# Patient Record
Sex: Male | Born: 1950
Health system: Southern US, Community
[De-identification: ages and names within clinical notes are randomized; demographics above are authoritative.]

## PROBLEM LIST (undated history)

## (undated) DIAGNOSIS — N189 Chronic kidney disease, unspecified: Secondary | ICD-10-CM

## (undated) DIAGNOSIS — I10 Essential (primary) hypertension: Secondary | ICD-10-CM

## (undated) HISTORY — PX: KIDNEY TRANSPLANT: SHX239

## (undated) HISTORY — DX: Essential (primary) hypertension: I10

## (undated) HISTORY — DX: Chronic kidney disease, unspecified: N18.9

## (undated) HISTORY — PX: HERNIA REPAIR: SHX51

---

## 2011-02-12 DIAGNOSIS — C44629 Squamous cell carcinoma of skin of left upper limb, including shoulder: Secondary | ICD-10-CM | POA: Insufficient documentation

## 2012-11-21 ENCOUNTER — Telehealth: Payer: Self-pay | Admitting: Family Medicine

## 2012-12-03 NOTE — Telephone Encounter (Signed)
No call back from 8-28. Will call back if appt still needed

## 2013-05-03 DIAGNOSIS — M2010 Hallux valgus (acquired), unspecified foot: Secondary | ICD-10-CM | POA: Diagnosis not present

## 2013-06-04 DIAGNOSIS — N039 Chronic nephritic syndrome with unspecified morphologic changes: Secondary | ICD-10-CM | POA: Diagnosis not present

## 2013-06-04 DIAGNOSIS — Z940 Kidney transplant status: Secondary | ICD-10-CM | POA: Diagnosis not present

## 2013-06-04 DIAGNOSIS — I10 Essential (primary) hypertension: Secondary | ICD-10-CM | POA: Diagnosis not present

## 2013-06-04 DIAGNOSIS — D631 Anemia in chronic kidney disease: Secondary | ICD-10-CM | POA: Diagnosis not present

## 2013-06-10 DIAGNOSIS — Z940 Kidney transplant status: Secondary | ICD-10-CM | POA: Diagnosis not present

## 2013-06-10 DIAGNOSIS — D631 Anemia in chronic kidney disease: Secondary | ICD-10-CM | POA: Diagnosis not present

## 2013-09-02 ENCOUNTER — Encounter (INDEPENDENT_AMBULATORY_CARE_PROVIDER_SITE_OTHER): Payer: Self-pay

## 2013-09-02 ENCOUNTER — Ambulatory Visit (INDEPENDENT_AMBULATORY_CARE_PROVIDER_SITE_OTHER): Payer: BC Managed Care – PPO | Admitting: Family Medicine

## 2013-09-02 ENCOUNTER — Encounter: Payer: Self-pay | Admitting: Family Medicine

## 2013-09-02 VITALS — BP 139/70 | HR 62 | Temp 97.3°F | Ht 70.0 in | Wt 167.0 lb

## 2013-09-02 DIAGNOSIS — J329 Chronic sinusitis, unspecified: Secondary | ICD-10-CM

## 2013-09-02 DIAGNOSIS — Z87448 Personal history of other diseases of urinary system: Secondary | ICD-10-CM

## 2013-09-02 DIAGNOSIS — I10 Essential (primary) hypertension: Secondary | ICD-10-CM | POA: Diagnosis not present

## 2013-09-02 DIAGNOSIS — Z87718 Personal history of other specified (corrected) congenital malformations of genitourinary system: Secondary | ICD-10-CM

## 2013-09-02 DIAGNOSIS — Z940 Kidney transplant status: Secondary | ICD-10-CM

## 2013-09-02 DIAGNOSIS — K219 Gastro-esophageal reflux disease without esophagitis: Secondary | ICD-10-CM

## 2013-09-02 DIAGNOSIS — J310 Chronic rhinitis: Secondary | ICD-10-CM

## 2013-09-02 MED ORDER — AMOXICILLIN 500 MG PO CAPS: 500.0000 mg | ORAL_CAPSULE | Freq: Three times a day (TID) | ORAL | Status: DC

## 2013-09-02 NOTE — Patient Instructions (Signed)
Drink plenty of fluids Take Tylenol as needed for aches pains and fever Take antibiotic as directed Mucinex blue and white over-the-counter one twice daily for cough and congestion with a large glass of water

## 2013-09-02 NOTE — Progress Notes (Signed)
Subjective:    Patient ID: Colton Todd, male    DOB: 03/03/1951, 63 y.o.   MRN: [id number]  HPI Patient here today for sinus trouble, drainage, and itchy eyes. He has had this problem for 2 weeks.        There are no active problems to display for this patient.  Outpatient Encounter Prescriptions as of 09/02/2013  Medication Sig  . allopurinol (ZYLOPRIM) 100 MG tablet Take 100 mg by mouth 2 (two) times daily.  . fluconazole (DIFLUCAN) 200 MG tablet Take 200 mg by mouth daily.  Marland Kitchen losartan (COZAAR) 50 MG tablet Take 50 mg by mouth daily.  . metoprolol tartrate (LOPRESSOR) 25 MG tablet Take 25 mg by mouth 2 (two) times daily.  . mycophenolate (MYFORTIC) 360 MG TBEC EC tablet Take 360 mg by mouth 2 (two) times daily.  Marland Kitchen NIFEdipine (PROCARDIA-XL/ADALAT CC) 60 MG 24 hr tablet Take 60 mg by mouth daily.  . pantoprazole (PROTONIX) 40 MG tablet Take 40 mg by mouth daily.  . pregabalin (LYRICA) 50 MG capsule Take 50 mg by mouth 3 (three) times daily.  . sodium bicarbonate 650 MG tablet Take 650 mg by mouth 4 (four) times daily.  . tacrolimus (PROGRAF) 1 MG capsule Take 1 mg by mouth 2 (two) times daily.    Review of Systems  Constitutional: Negative.  Negative for fever.  HENT: Positive for congestion, postnasal drip and sinus pressure.   Eyes: Positive for discharge and itching.  Respiratory: Negative.   Cardiovascular: Negative.   Gastrointestinal: Negative.   Endocrine: Negative.   Genitourinary: Negative.   Musculoskeletal: Negative.   Skin: Negative.   Allergic/Immunologic: Negative.   Neurological: Negative.  Negative for headaches.  Hematological: Negative.   Psychiatric/Behavioral: Negative.        Objective:   Physical Exam  Nursing note and vitals reviewed. Constitutional: He is oriented to person, place, and time. He appears well-developed and well-nourished. No distress.  HENT:  Head: Normocephalic and atraumatic.  Right Ear: External ear normal.  Left Ear:  External ear normal.  Mouth/Throat: Oropharynx is clear and moist. No oropharyngeal exudate.  Congestion bilaterally, no sinus tenderness with applied pressure  Eyes: EOM are normal. Pupils are equal, round, and reactive to light. Right eye exhibits no discharge. Left eye exhibits no discharge. No scleral icterus.  Conjunctivae are slightly red bilaterally  Neck: Normal range of motion. Neck supple. No thyromegaly present.  Cardiovascular: Normal rate, regular rhythm and normal heart sounds.   No murmur heard. At 60 per minute  Pulmonary/Chest: Effort normal and breath sounds normal. No respiratory distress. He has no wheezes. He has no rales. He exhibits no tenderness.  Slightly congested cough  Abdominal: Soft. Bowel sounds are normal. He exhibits no mass. There is no tenderness. There is no rebound and no guarding.  Transplant scars bilaterally  Genitourinary: Rectum normal.  Musculoskeletal: Normal range of motion. He exhibits no edema.  Lymphadenopathy:    He has no cervical adenopathy.  Neurological: He is alert and oriented to person, place, and time.  Skin: Skin is warm and dry. No rash noted.  Psychiatric: He has a normal mood and affect. His behavior is normal. Judgment and thought content normal.   BP 139/70  Pulse 62  Temp(Src) 97.3 F (36.3 C) (Oral)  Ht 5\' 10"  (1.778 m)  Wt 167 lb (75.751 kg)  BMI 23.96 kg/m2        Assessment & Plan:  1. Hypertension  2. GERD (gastroesophageal reflux  disease)  3. Rhinosinusitis - amoxicillin (AMOXIL) 500 MG capsule; Take 1 capsule (500 mg total) by mouth 3 (three) times daily.  Dispense: 30 capsule; Refill: 0  4. History of polycystic kidney disease  5. History of renal transplant Patient Instructions  Drink plenty of fluids Take Tylenol as needed for aches pains and fever Take antibiotic as directed Mucinex blue and white over-the-counter one twice daily for cough and congestion with a large glass of water   Arrie Senate MD

## 2013-10-08 DIAGNOSIS — D631 Anemia in chronic kidney disease: Secondary | ICD-10-CM | POA: Diagnosis not present

## 2013-10-08 DIAGNOSIS — Z940 Kidney transplant status: Secondary | ICD-10-CM | POA: Diagnosis not present

## 2013-10-08 DIAGNOSIS — I10 Essential (primary) hypertension: Secondary | ICD-10-CM | POA: Diagnosis not present

## 2013-10-08 DIAGNOSIS — N039 Chronic nephritic syndrome with unspecified morphologic changes: Secondary | ICD-10-CM | POA: Diagnosis not present

## 2013-11-24 DIAGNOSIS — I808 Phlebitis and thrombophlebitis of other sites: Secondary | ICD-10-CM | POA: Diagnosis not present

## 2013-11-25 DIAGNOSIS — T82898A Other specified complication of vascular prosthetic devices, implants and grafts, initial encounter: Secondary | ICD-10-CM | POA: Diagnosis not present

## 2013-11-25 DIAGNOSIS — M79609 Pain in unspecified limb: Secondary | ICD-10-CM | POA: Diagnosis not present

## 2013-11-25 DIAGNOSIS — I749 Embolism and thrombosis of unspecified artery: Secondary | ICD-10-CM | POA: Diagnosis not present

## 2013-12-16 DIAGNOSIS — C44221 Squamous cell carcinoma of skin of unspecified ear and external auricular canal: Secondary | ICD-10-CM | POA: Diagnosis not present

## 2013-12-16 DIAGNOSIS — D485 Neoplasm of uncertain behavior of skin: Secondary | ICD-10-CM | POA: Diagnosis not present

## 2013-12-16 DIAGNOSIS — L819 Disorder of pigmentation, unspecified: Secondary | ICD-10-CM | POA: Diagnosis not present

## 2013-12-16 DIAGNOSIS — Z85828 Personal history of other malignant neoplasm of skin: Secondary | ICD-10-CM | POA: Diagnosis not present

## 2014-01-27 DIAGNOSIS — D0422 Carcinoma in situ of skin of left ear and external auricular canal: Secondary | ICD-10-CM | POA: Diagnosis not present

## 2014-01-27 DIAGNOSIS — C44519 Basal cell carcinoma of skin of other part of trunk: Secondary | ICD-10-CM | POA: Diagnosis not present

## 2014-01-27 DIAGNOSIS — D0439 Carcinoma in situ of skin of other parts of face: Secondary | ICD-10-CM | POA: Diagnosis not present

## 2014-01-27 DIAGNOSIS — C44319 Basal cell carcinoma of skin of other parts of face: Secondary | ICD-10-CM | POA: Diagnosis not present

## 2014-02-18 DIAGNOSIS — Q612 Polycystic kidney, adult type: Secondary | ICD-10-CM | POA: Diagnosis not present

## 2014-02-18 DIAGNOSIS — Z940 Kidney transplant status: Secondary | ICD-10-CM | POA: Diagnosis not present

## 2014-02-18 DIAGNOSIS — I10 Essential (primary) hypertension: Secondary | ICD-10-CM | POA: Diagnosis not present

## 2014-05-27 DIAGNOSIS — I10 Essential (primary) hypertension: Secondary | ICD-10-CM | POA: Diagnosis not present

## 2014-05-27 DIAGNOSIS — Z940 Kidney transplant status: Secondary | ICD-10-CM | POA: Diagnosis not present

## 2014-05-27 DIAGNOSIS — D631 Anemia in chronic kidney disease: Secondary | ICD-10-CM | POA: Diagnosis not present

## 2014-08-11 DIAGNOSIS — D489 Neoplasm of uncertain behavior, unspecified: Secondary | ICD-10-CM | POA: Diagnosis not present

## 2014-08-11 DIAGNOSIS — L570 Actinic keratosis: Secondary | ICD-10-CM | POA: Diagnosis not present

## 2014-08-11 DIAGNOSIS — Z85828 Personal history of other malignant neoplasm of skin: Secondary | ICD-10-CM | POA: Diagnosis not present

## 2014-08-18 DIAGNOSIS — I10 Essential (primary) hypertension: Secondary | ICD-10-CM | POA: Diagnosis not present

## 2014-08-18 DIAGNOSIS — Z940 Kidney transplant status: Secondary | ICD-10-CM | POA: Diagnosis not present

## 2014-08-18 DIAGNOSIS — L03114 Cellulitis of left upper limb: Secondary | ICD-10-CM | POA: Diagnosis not present

## 2014-09-16 DIAGNOSIS — L03221 Cellulitis of neck: Secondary | ICD-10-CM | POA: Diagnosis not present

## 2014-09-16 DIAGNOSIS — W19XXXA Unspecified fall, initial encounter: Secondary | ICD-10-CM | POA: Diagnosis not present

## 2014-09-16 DIAGNOSIS — M542 Cervicalgia: Secondary | ICD-10-CM | POA: Diagnosis not present

## 2014-09-16 DIAGNOSIS — S161XXA Strain of muscle, fascia and tendon at neck level, initial encounter: Secondary | ICD-10-CM | POA: Diagnosis not present

## 2014-09-16 DIAGNOSIS — S199XXA Unspecified injury of neck, initial encounter: Secondary | ICD-10-CM | POA: Diagnosis not present

## 2015-01-27 DIAGNOSIS — C44619 Basal cell carcinoma of skin of left upper limb, including shoulder: Secondary | ICD-10-CM | POA: Diagnosis not present

## 2015-01-27 DIAGNOSIS — Z85828 Personal history of other malignant neoplasm of skin: Secondary | ICD-10-CM | POA: Diagnosis not present

## 2015-01-27 DIAGNOSIS — Z87891 Personal history of nicotine dependence: Secondary | ICD-10-CM | POA: Diagnosis not present

## 2015-01-27 DIAGNOSIS — L570 Actinic keratosis: Secondary | ICD-10-CM | POA: Diagnosis not present

## 2015-01-27 DIAGNOSIS — I129 Hypertensive chronic kidney disease with stage 1 through stage 4 chronic kidney disease, or unspecified chronic kidney disease: Secondary | ICD-10-CM | POA: Diagnosis not present

## 2015-01-27 DIAGNOSIS — D0462 Carcinoma in situ of skin of left upper limb, including shoulder: Secondary | ICD-10-CM | POA: Diagnosis not present

## 2015-01-27 DIAGNOSIS — N189 Chronic kidney disease, unspecified: Secondary | ICD-10-CM | POA: Diagnosis not present

## 2015-02-10 DIAGNOSIS — D0462 Carcinoma in situ of skin of left upper limb, including shoulder: Secondary | ICD-10-CM | POA: Diagnosis not present

## 2015-02-10 DIAGNOSIS — C44619 Basal cell carcinoma of skin of left upper limb, including shoulder: Secondary | ICD-10-CM | POA: Diagnosis not present

## 2015-06-22 ENCOUNTER — Encounter: Payer: Self-pay | Admitting: Family Medicine

## 2015-06-22 ENCOUNTER — Ambulatory Visit (INDEPENDENT_AMBULATORY_CARE_PROVIDER_SITE_OTHER): Payer: BLUE CROSS/BLUE SHIELD | Admitting: Family Medicine

## 2015-06-22 VITALS — BP 124/78 | HR 73 | Temp 97.2°F | Ht 70.0 in | Wt 165.0 lb

## 2015-06-22 DIAGNOSIS — K140 Glossitis: Secondary | ICD-10-CM

## 2015-06-22 MED ORDER — AMOXICILLIN 875 MG PO TABS: 875.0000 mg | ORAL_TABLET | Freq: Two times a day (BID) | ORAL | Status: DC

## 2015-06-22 MED ORDER — VALACYCLOVIR HCL 1 G PO TABS: 1000.0000 mg | ORAL_TABLET | Freq: Three times a day (TID) | ORAL | Status: DC

## 2015-06-22 NOTE — Progress Notes (Signed)
BP 124/78 mmHg  Pulse 73  Temp(Src) 97.2 F (36.2 C) (Oral)  Ht 5\' 10"  (1.778 m)  Wt 165 lb (74.844 kg)  BMI 23.68 kg/m2   Subjective:    Patient ID: Colton Todd, male    DOB: 1951-02-06, 65 y.o.   MRN: [id number]  HPI: Colton Todd is a 65 y.o. male presenting on 06/22/2015 for Lesion on right side of tongue   HPI Ulceration on tongue Patient is coming in today because he has had an ulceration on the right side of his tongue that he says only been there for about 5 days. He says he has a lot of pain and tenderness associated with it. Especially when he chooses tobacco he has a lot of pain. He denies any fevers or chills or any sores anywhere else. He has been chewing tobacco for quite a few years. He has not had any drainage from the area. He does note that side of his mouth and he chews tobacco on him regularly.  Relevant past medical, surgical, family and social history reviewed and updated as indicated. Interim medical history since our last visit reviewed. Allergies and medications reviewed and updated.  Review of Systems  Constitutional: Negative for fever.  HENT: Positive for mouth sores. Negative for ear discharge and ear pain.   Eyes: Negative for discharge and visual disturbance.  Respiratory: Negative for shortness of breath and wheezing.   Cardiovascular: Negative for chest pain and leg swelling.  Gastrointestinal: Negative for abdominal pain, diarrhea and constipation.  Genitourinary: Negative for difficulty urinating.  Musculoskeletal: Negative for back pain and gait problem.  Skin: Negative for rash.  Neurological: Negative for syncope, light-headedness and headaches.  All other systems reviewed and are negative.   Per HPI unless specifically indicated above     Medication List       This list is accurate as of: 06/22/15 11:15 AM.  Always use your most recent med list.               allopurinol 100 MG tablet  Commonly known as:  ZYLOPRIM  Take  100 mg by mouth 2 (two) times daily.     amoxicillin 875 MG tablet  Commonly known as:  AMOXIL  Take 1 tablet (875 mg total) by mouth 2 (two) times daily.     losartan 50 MG tablet  Commonly known as:  COZAAR  Take 50 mg by mouth daily.     metoprolol tartrate 25 MG tablet  Commonly known as:  LOPRESSOR  Take 25 mg by mouth 2 (two) times daily.     mycophenolate 360 MG Tbec EC tablet  Commonly known as:  MYFORTIC  Take 360 mg by mouth 2 (two) times daily.     mycophenolate 180 MG EC tablet  Commonly known as:  MYFORTIC  Take 180 mg by mouth.     NIFEdipine 60 MG 24 hr tablet  Commonly known as:  PROCARDIA-XL/ADALAT CC  Take 60 mg by mouth daily.     NIFEdipine 60 MG 24 hr tablet  Commonly known as:  PROCARDIA-XL/ADALAT CC  Take 60 mg by mouth.     pantoprazole 40 MG tablet  Commonly known as:  PROTONIX  Take 40 mg by mouth daily.     pregabalin 50 MG capsule  Commonly known as:  LYRICA  Take 50 mg by mouth 3 (three) times daily.     sodium bicarbonate 650 MG tablet  Take 650 mg by mouth 4 (four)  times daily.     valACYclovir 1000 MG tablet  Commonly known as:  VALTREX  Take 1 tablet (1,000 mg total) by mouth 3 (three) times daily.           Objective:    BP 124/78 mmHg  Pulse 73  Temp(Src) 97.2 F (36.2 C) (Oral)  Ht 5\' 10"  (1.778 m)  Wt 165 lb (74.844 kg)  BMI 23.68 kg/m2  Wt Readings from Last 3 Encounters:  06/22/15 165 lb (74.844 kg)  09/02/13 167 lb (75.751 kg)    Physical Exam  Constitutional: He is oriented to person, place, and time. He appears well-developed and well-nourished. No distress.  HENT:  Right Ear: Tympanic membrane normal.  Left Ear: Tympanic membrane normal.  Nose: Nose normal.  Mouth/Throat: Uvula is midline, oropharynx is clear and moist and mucous membranes are normal.  Ulceration on the right aspect of his tongue. 1 cm ulceration with small indentation in the center.  Eyes: Conjunctivae and EOM are normal. Pupils are  equal, round, and reactive to light. Right eye exhibits no discharge. No scleral icterus.  Neck: Neck supple. No thyromegaly present.  Cardiovascular: Normal rate, regular rhythm, normal heart sounds and intact distal pulses.   No murmur heard. Pulmonary/Chest: Effort normal and breath sounds normal. No respiratory distress. He has no wheezes.  Musculoskeletal: Normal range of motion. He exhibits no edema.  Lymphadenopathy:    He has no cervical adenopathy.  Neurological: He is alert and oriented to person, place, and time. Coordination normal.  Skin: Skin is warm and dry. No rash noted. He is not diaphoretic.  Psychiatric: He has a normal mood and affect. His behavior is normal.  Vitals reviewed.   No results found for this or any previous visit.    Assessment & Plan:       Problem List Items Addressed This Visit    None    Visit Diagnoses    Ulcerated tongue    -  Primary    Large 1 cm ulceration on right lateral tongue, antibiotic and antiviral, return in 1 week, patient chews tobacco, consider ENT if not resolved    Relevant Medications    valACYclovir (VALTREX) 1000 MG tablet    amoxicillin (AMOXIL) 875 MG tablet        Follow up plan: Return in about 1 week (around 06/29/2015), or if symptoms worsen or fail to improve, for Follow-up tongue ulceration.  Counseling provided for all of the vaccine components No orders of the defined types were placed in this encounter.    Caryl Pina, MD [facility] 06/22/2015, 11:15 AM

## 2015-06-27 DIAGNOSIS — N186 End stage renal disease: Secondary | ICD-10-CM | POA: Diagnosis present

## 2015-06-27 DIAGNOSIS — Z940 Kidney transplant status: Secondary | ICD-10-CM | POA: Diagnosis not present

## 2015-06-27 DIAGNOSIS — I10 Essential (primary) hypertension: Secondary | ICD-10-CM | POA: Diagnosis not present

## 2015-06-27 DIAGNOSIS — B955 Unspecified streptococcus as the cause of diseases classified elsewhere: Secondary | ICD-10-CM | POA: Diagnosis present

## 2015-06-27 DIAGNOSIS — M01X0 Direct infection of unspecified joint in infectious and parasitic diseases classified elsewhere: Secondary | ICD-10-CM | POA: Diagnosis not present

## 2015-06-27 DIAGNOSIS — I253 Aneurysm of heart: Secondary | ICD-10-CM | POA: Diagnosis not present

## 2015-06-27 DIAGNOSIS — M00022 Staphylococcal arthritis, left elbow: Secondary | ICD-10-CM | POA: Diagnosis present

## 2015-06-27 DIAGNOSIS — Q613 Polycystic kidney, unspecified: Secondary | ICD-10-CM | POA: Diagnosis not present

## 2015-06-27 DIAGNOSIS — Z79899 Other long term (current) drug therapy: Secondary | ICD-10-CM | POA: Diagnosis not present

## 2015-06-27 DIAGNOSIS — A498 Other bacterial infections of unspecified site: Secondary | ICD-10-CM | POA: Diagnosis not present

## 2015-06-27 DIAGNOSIS — I313 Pericardial effusion (noninflammatory): Secondary | ICD-10-CM | POA: Diagnosis present

## 2015-06-27 DIAGNOSIS — I120 Hypertensive chronic kidney disease with stage 5 chronic kidney disease or end stage renal disease: Secondary | ICD-10-CM | POA: Diagnosis present

## 2015-06-27 DIAGNOSIS — I301 Infective pericarditis: Secondary | ICD-10-CM | POA: Diagnosis present

## 2015-06-27 DIAGNOSIS — M25522 Pain in left elbow: Secondary | ICD-10-CM | POA: Diagnosis not present

## 2015-06-27 DIAGNOSIS — R7881 Bacteremia: Secondary | ICD-10-CM | POA: Diagnosis not present

## 2015-06-27 DIAGNOSIS — M109 Gout, unspecified: Secondary | ICD-10-CM | POA: Diagnosis present

## 2015-06-27 DIAGNOSIS — K120 Recurrent oral aphthae: Secondary | ICD-10-CM | POA: Diagnosis present

## 2015-06-27 DIAGNOSIS — I314 Cardiac tamponade: Secondary | ICD-10-CM | POA: Diagnosis present

## 2015-06-27 DIAGNOSIS — Z87891 Personal history of nicotine dependence: Secondary | ICD-10-CM | POA: Diagnosis not present

## 2015-06-27 DIAGNOSIS — G629 Polyneuropathy, unspecified: Secondary | ICD-10-CM | POA: Diagnosis present

## 2015-06-29 ENCOUNTER — Ambulatory Visit: Payer: Medicare Other | Admitting: Family Medicine

## 2015-07-18 DIAGNOSIS — M009 Pyogenic arthritis, unspecified: Secondary | ICD-10-CM | POA: Diagnosis not present

## 2015-07-18 DIAGNOSIS — Z940 Kidney transplant status: Secondary | ICD-10-CM | POA: Diagnosis not present

## 2015-07-18 DIAGNOSIS — I301 Infective pericarditis: Secondary | ICD-10-CM | POA: Diagnosis not present

## 2015-07-26 DIAGNOSIS — L989 Disorder of the skin and subcutaneous tissue, unspecified: Secondary | ICD-10-CM | POA: Diagnosis not present

## 2015-07-26 DIAGNOSIS — Z85828 Personal history of other malignant neoplasm of skin: Secondary | ICD-10-CM | POA: Diagnosis not present

## 2015-07-26 DIAGNOSIS — L570 Actinic keratosis: Secondary | ICD-10-CM | POA: Diagnosis not present

## 2015-08-31 DIAGNOSIS — I10 Essential (primary) hypertension: Secondary | ICD-10-CM | POA: Diagnosis not present

## 2015-08-31 DIAGNOSIS — Z940 Kidney transplant status: Secondary | ICD-10-CM | POA: Diagnosis not present

## 2015-08-31 DIAGNOSIS — M009 Pyogenic arthritis, unspecified: Secondary | ICD-10-CM | POA: Diagnosis not present

## 2015-08-31 DIAGNOSIS — I301 Infective pericarditis: Secondary | ICD-10-CM | POA: Diagnosis not present

## 2015-10-19 DIAGNOSIS — Z720 Tobacco use: Secondary | ICD-10-CM | POA: Diagnosis not present

## 2015-10-19 DIAGNOSIS — T82868D Thrombosis of vascular prosthetic devices, implants and grafts, subsequent encounter: Secondary | ICD-10-CM | POA: Diagnosis not present

## 2015-10-24 DIAGNOSIS — T82898A Other specified complication of vascular prosthetic devices, implants and grafts, initial encounter: Secondary | ICD-10-CM | POA: Diagnosis not present

## 2015-10-24 DIAGNOSIS — T827XXA Infection and inflammatory reaction due to other cardiac and vascular devices, implants and grafts, initial encounter: Secondary | ICD-10-CM | POA: Diagnosis not present

## 2015-12-01 DIAGNOSIS — Q613 Polycystic kidney, unspecified: Secondary | ICD-10-CM | POA: Diagnosis not present

## 2015-12-01 DIAGNOSIS — M1000 Idiopathic gout, unspecified site: Secondary | ICD-10-CM | POA: Diagnosis not present

## 2015-12-01 DIAGNOSIS — I10 Essential (primary) hypertension: Secondary | ICD-10-CM | POA: Diagnosis not present

## 2015-12-01 DIAGNOSIS — Z940 Kidney transplant status: Secondary | ICD-10-CM | POA: Diagnosis not present

## 2015-12-07 DIAGNOSIS — C44219 Basal cell carcinoma of skin of left ear and external auricular canal: Secondary | ICD-10-CM | POA: Diagnosis not present

## 2015-12-07 DIAGNOSIS — C44519 Basal cell carcinoma of skin of other part of trunk: Secondary | ICD-10-CM | POA: Diagnosis not present

## 2015-12-07 DIAGNOSIS — C44311 Basal cell carcinoma of skin of nose: Secondary | ICD-10-CM | POA: Diagnosis not present

## 2015-12-07 DIAGNOSIS — C44619 Basal cell carcinoma of skin of left upper limb, including shoulder: Secondary | ICD-10-CM | POA: Diagnosis not present

## 2015-12-07 DIAGNOSIS — L570 Actinic keratosis: Secondary | ICD-10-CM | POA: Diagnosis not present

## 2015-12-07 DIAGNOSIS — Z85828 Personal history of other malignant neoplasm of skin: Secondary | ICD-10-CM | POA: Diagnosis not present

## 2015-12-07 DIAGNOSIS — C44112 Basal cell carcinoma of skin of right eyelid, including canthus: Secondary | ICD-10-CM | POA: Diagnosis not present

## 2015-12-10 DIAGNOSIS — E213 Hyperparathyroidism, unspecified: Secondary | ICD-10-CM | POA: Diagnosis not present

## 2015-12-10 DIAGNOSIS — K567 Ileus, unspecified: Secondary | ICD-10-CM | POA: Diagnosis not present

## 2015-12-10 DIAGNOSIS — I313 Pericardial effusion (noninflammatory): Secondary | ICD-10-CM | POA: Diagnosis not present

## 2015-12-10 DIAGNOSIS — Z940 Kidney transplant status: Secondary | ICD-10-CM | POA: Diagnosis not present

## 2015-12-10 DIAGNOSIS — R079 Chest pain, unspecified: Secondary | ICD-10-CM | POA: Diagnosis not present

## 2015-12-10 DIAGNOSIS — Z79899 Other long term (current) drug therapy: Secondary | ICD-10-CM | POA: Diagnosis not present

## 2015-12-10 DIAGNOSIS — M109 Gout, unspecified: Secondary | ICD-10-CM | POA: Diagnosis not present

## 2015-12-10 DIAGNOSIS — R071 Chest pain on breathing: Secondary | ICD-10-CM | POA: Diagnosis not present

## 2015-12-10 DIAGNOSIS — Z885 Allergy status to narcotic agent status: Secondary | ICD-10-CM | POA: Diagnosis not present

## 2015-12-10 DIAGNOSIS — D899 Disorder involving the immune mechanism, unspecified: Secondary | ICD-10-CM | POA: Diagnosis not present

## 2015-12-10 DIAGNOSIS — I10 Essential (primary) hypertension: Secondary | ICD-10-CM | POA: Diagnosis not present

## 2015-12-10 DIAGNOSIS — E876 Hypokalemia: Secondary | ICD-10-CM | POA: Diagnosis present

## 2015-12-10 DIAGNOSIS — J302 Other seasonal allergic rhinitis: Secondary | ICD-10-CM | POA: Diagnosis present

## 2015-12-10 DIAGNOSIS — R140 Abdominal distension (gaseous): Secondary | ICD-10-CM | POA: Diagnosis not present

## 2015-12-10 DIAGNOSIS — G629 Polyneuropathy, unspecified: Secondary | ICD-10-CM | POA: Diagnosis present

## 2015-12-10 DIAGNOSIS — M1990 Unspecified osteoarthritis, unspecified site: Secondary | ICD-10-CM | POA: Diagnosis present

## 2015-12-10 DIAGNOSIS — Z85828 Personal history of other malignant neoplasm of skin: Secondary | ICD-10-CM | POA: Diagnosis not present

## 2015-12-10 DIAGNOSIS — D696 Thrombocytopenia, unspecified: Secondary | ICD-10-CM | POA: Diagnosis present

## 2015-12-22 DIAGNOSIS — E213 Hyperparathyroidism, unspecified: Secondary | ICD-10-CM | POA: Diagnosis not present

## 2015-12-22 DIAGNOSIS — Z940 Kidney transplant status: Secondary | ICD-10-CM | POA: Diagnosis not present

## 2015-12-22 DIAGNOSIS — Q613 Polycystic kidney, unspecified: Secondary | ICD-10-CM | POA: Diagnosis not present

## 2015-12-22 DIAGNOSIS — I10 Essential (primary) hypertension: Secondary | ICD-10-CM | POA: Diagnosis not present

## 2016-01-23 DIAGNOSIS — Z23 Encounter for immunization: Secondary | ICD-10-CM | POA: Diagnosis not present

## 2016-01-31 DIAGNOSIS — Z23 Encounter for immunization: Secondary | ICD-10-CM | POA: Diagnosis not present

## 2016-03-02 DIAGNOSIS — I10 Essential (primary) hypertension: Secondary | ICD-10-CM | POA: Diagnosis not present

## 2016-03-02 DIAGNOSIS — I313 Pericardial effusion (noninflammatory): Secondary | ICD-10-CM | POA: Diagnosis not present

## 2016-03-02 DIAGNOSIS — Z940 Kidney transplant status: Secondary | ICD-10-CM | POA: Diagnosis not present

## 2016-03-02 DIAGNOSIS — E212 Other hyperparathyroidism: Secondary | ICD-10-CM | POA: Diagnosis not present

## 2016-03-12 DIAGNOSIS — C44619 Basal cell carcinoma of skin of left upper limb, including shoulder: Secondary | ICD-10-CM | POA: Diagnosis not present

## 2016-03-12 DIAGNOSIS — C44311 Basal cell carcinoma of skin of nose: Secondary | ICD-10-CM | POA: Diagnosis not present

## 2016-03-20 DIAGNOSIS — Z483 Aftercare following surgery for neoplasm: Secondary | ICD-10-CM | POA: Diagnosis not present

## 2016-03-20 DIAGNOSIS — C44311 Basal cell carcinoma of skin of nose: Secondary | ICD-10-CM | POA: Diagnosis not present

## 2016-03-20 DIAGNOSIS — C44619 Basal cell carcinoma of skin of left upper limb, including shoulder: Secondary | ICD-10-CM | POA: Diagnosis not present

## 2016-03-27 DIAGNOSIS — Z483 Aftercare following surgery for neoplasm: Secondary | ICD-10-CM | POA: Diagnosis not present

## 2016-05-22 ENCOUNTER — Telehealth: Payer: Self-pay | Admitting: Family Medicine

## 2016-07-03 DIAGNOSIS — I10 Essential (primary) hypertension: Secondary | ICD-10-CM | POA: Diagnosis not present

## 2016-07-03 DIAGNOSIS — I313 Pericardial effusion (noninflammatory): Secondary | ICD-10-CM | POA: Diagnosis not present

## 2016-07-03 DIAGNOSIS — M1000 Idiopathic gout, unspecified site: Secondary | ICD-10-CM | POA: Diagnosis not present

## 2016-07-03 DIAGNOSIS — Z940 Kidney transplant status: Secondary | ICD-10-CM | POA: Diagnosis not present

## 2016-07-20 DIAGNOSIS — Z940 Kidney transplant status: Secondary | ICD-10-CM | POA: Diagnosis not present

## 2016-09-20 DIAGNOSIS — C44219 Basal cell carcinoma of skin of left ear and external auricular canal: Secondary | ICD-10-CM | POA: Diagnosis not present

## 2016-09-20 DIAGNOSIS — C44319 Basal cell carcinoma of skin of other parts of face: Secondary | ICD-10-CM | POA: Diagnosis not present

## 2016-09-20 DIAGNOSIS — L304 Erythema intertrigo: Secondary | ICD-10-CM | POA: Diagnosis not present

## 2016-11-08 DIAGNOSIS — M1000 Idiopathic gout, unspecified site: Secondary | ICD-10-CM | POA: Diagnosis not present

## 2016-11-08 DIAGNOSIS — Z940 Kidney transplant status: Secondary | ICD-10-CM | POA: Diagnosis not present

## 2016-11-08 DIAGNOSIS — N183 Chronic kidney disease, stage 3 (moderate): Secondary | ICD-10-CM | POA: Diagnosis not present

## 2016-11-08 DIAGNOSIS — I10 Essential (primary) hypertension: Secondary | ICD-10-CM | POA: Diagnosis not present

## 2016-11-08 DIAGNOSIS — Q613 Polycystic kidney, unspecified: Secondary | ICD-10-CM | POA: Diagnosis not present

## 2017-01-21 DIAGNOSIS — Z23 Encounter for immunization: Secondary | ICD-10-CM | POA: Diagnosis not present

## 2017-03-14 DIAGNOSIS — Q613 Polycystic kidney, unspecified: Secondary | ICD-10-CM | POA: Diagnosis not present

## 2017-03-14 DIAGNOSIS — M1000 Idiopathic gout, unspecified site: Secondary | ICD-10-CM | POA: Diagnosis not present

## 2017-03-14 DIAGNOSIS — I10 Essential (primary) hypertension: Secondary | ICD-10-CM | POA: Diagnosis not present

## 2017-03-14 DIAGNOSIS — Z940 Kidney transplant status: Secondary | ICD-10-CM | POA: Diagnosis not present

## 2017-03-14 DIAGNOSIS — N183 Chronic kidney disease, stage 3 (moderate): Secondary | ICD-10-CM | POA: Diagnosis not present

## 2017-03-14 DIAGNOSIS — E212 Other hyperparathyroidism: Secondary | ICD-10-CM | POA: Diagnosis not present

## 2017-04-16 DIAGNOSIS — C44319 Basal cell carcinoma of skin of other parts of face: Secondary | ICD-10-CM | POA: Diagnosis not present

## 2017-04-16 DIAGNOSIS — L578 Other skin changes due to chronic exposure to nonionizing radiation: Secondary | ICD-10-CM | POA: Diagnosis not present

## 2017-04-16 DIAGNOSIS — C44219 Basal cell carcinoma of skin of left ear and external auricular canal: Secondary | ICD-10-CM | POA: Diagnosis not present

## 2017-07-18 DIAGNOSIS — M542 Cervicalgia: Secondary | ICD-10-CM | POA: Diagnosis not present

## 2017-07-18 DIAGNOSIS — I10 Essential (primary) hypertension: Secondary | ICD-10-CM | POA: Diagnosis not present

## 2017-07-18 DIAGNOSIS — Q613 Polycystic kidney, unspecified: Secondary | ICD-10-CM | POA: Diagnosis not present

## 2017-07-18 DIAGNOSIS — M19041 Primary osteoarthritis, right hand: Secondary | ICD-10-CM | POA: Diagnosis not present

## 2017-07-18 DIAGNOSIS — Z940 Kidney transplant status: Secondary | ICD-10-CM | POA: Diagnosis not present

## 2017-07-18 DIAGNOSIS — M1000 Idiopathic gout, unspecified site: Secondary | ICD-10-CM | POA: Diagnosis not present

## 2017-07-18 DIAGNOSIS — N183 Chronic kidney disease, stage 3 (moderate): Secondary | ICD-10-CM | POA: Diagnosis not present

## 2017-07-25 DIAGNOSIS — M5020 Other cervical disc displacement, unspecified cervical region: Secondary | ICD-10-CM | POA: Diagnosis not present

## 2017-07-25 DIAGNOSIS — Z886 Allergy status to analgesic agent status: Secondary | ICD-10-CM | POA: Diagnosis not present

## 2017-07-25 DIAGNOSIS — M4312 Spondylolisthesis, cervical region: Secondary | ICD-10-CM | POA: Diagnosis not present

## 2017-07-25 DIAGNOSIS — M47892 Other spondylosis, cervical region: Secondary | ICD-10-CM | POA: Diagnosis not present

## 2017-07-25 DIAGNOSIS — M542 Cervicalgia: Secondary | ICD-10-CM | POA: Diagnosis not present

## 2017-07-25 DIAGNOSIS — M9971 Connective tissue and disc stenosis of intervertebral foramina of cervical region: Secondary | ICD-10-CM | POA: Diagnosis not present

## 2017-08-14 DIAGNOSIS — N183 Chronic kidney disease, stage 3 (moderate): Secondary | ICD-10-CM | POA: Diagnosis not present

## 2017-08-14 DIAGNOSIS — M1000 Idiopathic gout, unspecified site: Secondary | ICD-10-CM | POA: Diagnosis not present

## 2017-08-14 DIAGNOSIS — Z940 Kidney transplant status: Secondary | ICD-10-CM | POA: Diagnosis not present

## 2017-11-06 ENCOUNTER — Encounter: Payer: Self-pay | Admitting: Pediatrics

## 2017-11-06 ENCOUNTER — Ambulatory Visit (INDEPENDENT_AMBULATORY_CARE_PROVIDER_SITE_OTHER): Payer: BLUE CROSS/BLUE SHIELD | Admitting: Pediatrics

## 2017-11-06 VITALS — BP 99/58 | HR 70 | Temp 97.7°F | Ht 70.0 in | Wt 145.0 lb

## 2017-11-06 DIAGNOSIS — J069 Acute upper respiratory infection, unspecified: Secondary | ICD-10-CM | POA: Diagnosis not present

## 2017-11-06 DIAGNOSIS — K130 Diseases of lips: Secondary | ICD-10-CM

## 2017-11-06 MED ORDER — FLUTICASONE PROPIONATE 50 MCG/ACT NA SUSP: 2.0000 | Freq: Every day | NASAL | 6 refills | Status: DC

## 2017-11-06 NOTE — Progress Notes (Signed)
  Subjective:   Patient ID: Colton Todd, male    DOB: [birth year], 67 y.o.   MRN: [id number] CC: Nasal Congestion  HPI: Colton Todd is a 67 y.o. male   History of kidney transplant, on immunosuppressants.  Has had nasal congestion daily for the last few months.  Usually has to blow his nose couple times when he first wakes up in the morning.  Often clear, sometimes green-tinged.  Today he had to blow his nose throughout the day.  Clear to green-tinged.  His appetite has been okay.  Drinking plenty of water.  No fevers.  No lightheadedness or dizziness.  He follows with nephrology regularly.  He recently stopped using chewing tobacco.  He had an ulcer in the left side of his lower lip which is healed.  The last few days he now has an ulcer on the right side of his lip.  It was scabbed over, he thinks is been healing.  Relevant past medical, surgical, family and social history reviewed. Allergies and medications reviewed and updated. Social History   Tobacco Use  Smoking Status Former Smoker  Smokeless Tobacco Architectural technologist  . Types: Chew   ROS: Per HPI   Objective:    BP (!) 99/58   Pulse 70   Temp 97.7 F (36.5 C) (Oral)   Ht 5\' 10"  (1.778 m)   Wt 145 lb (65.8 kg)   BMI 20.81 kg/m   Wt Readings from Last 3 Encounters:  11/06/17 145 lb (65.8 kg)  06/22/15 165 lb (74.8 kg)  09/02/13 167 lb (75.8 kg)    Gen: NAD, alert, cooperative with exam, NCAT EYES: EOMI, no conjunctival injection, or no icterus ENT:  TMs pearly gray b/l, OP without erythema, no tenderness to palpation over sinuses LYMPH: no cervical LAD CV: NRRR, normal S1/S2, no murmur, distal pulses 2+ b/l Resp: CTABL, no wheezes, normal WOB Abd: +BS, soft, NTND. no guarding or organomegaly Ext: No edema, warm Neuro: Alert and oriented, strength equal b/l UE and LE, coordination grossly normal MSK: normal muscle bulk  Assessment & Plan:  Dhanush was seen today for nasal congestion.  Diagnoses and all orders  for this visit:  Acute URI Sinus rinses with distilled water.  Start below.  If not improving let us know. -     fluticasone (FLONASE) 50 MCG/ACT nasal spray; Place 2 sprays into both nostrils daily.  Lip ulcer If not improving within the week let me know, will refer to ENT.  Follow up plan: Return in about 1 month (around 12/04/2017).  Assunta Found, MD [place]

## 2017-12-04 DIAGNOSIS — M1000 Idiopathic gout, unspecified site: Secondary | ICD-10-CM | POA: Diagnosis not present

## 2017-12-04 DIAGNOSIS — N186 End stage renal disease: Secondary | ICD-10-CM | POA: Diagnosis not present

## 2017-12-04 DIAGNOSIS — Q613 Polycystic kidney, unspecified: Secondary | ICD-10-CM | POA: Diagnosis not present

## 2017-12-04 DIAGNOSIS — N183 Chronic kidney disease, stage 3 (moderate): Secondary | ICD-10-CM | POA: Diagnosis not present

## 2017-12-04 DIAGNOSIS — Z940 Kidney transplant status: Secondary | ICD-10-CM | POA: Diagnosis not present

## 2017-12-04 DIAGNOSIS — Z79899 Other long term (current) drug therapy: Secondary | ICD-10-CM | POA: Diagnosis not present

## 2017-12-16 ENCOUNTER — Ambulatory Visit (INDEPENDENT_AMBULATORY_CARE_PROVIDER_SITE_OTHER): Payer: BLUE CROSS/BLUE SHIELD | Admitting: Pediatrics

## 2017-12-16 ENCOUNTER — Encounter: Payer: Self-pay | Admitting: Pediatrics

## 2017-12-16 VITALS — BP 186/102 | HR 80 | Temp 99.3°F | Ht 70.0 in | Wt 147.0 lb

## 2017-12-16 DIAGNOSIS — I10 Essential (primary) hypertension: Secondary | ICD-10-CM | POA: Diagnosis not present

## 2017-12-16 MED ORDER — NIFEDIPINE ER OSMOTIC RELEASE 30 MG PO TB24: 30.0000 mg | ORAL_TABLET | Freq: Every day | ORAL | 1 refills | Status: DC

## 2017-12-16 NOTE — Progress Notes (Signed)
  Subjective:   Patient ID: Colton Todd, male    DOB: 1951-03-21, 67 y.o.   MRN: [id number] CC: Medical Management of Chronic Issues (1 month Follow up)  HPI: Colton Todd is a 67 y.o. male   HTN: Not taking nifedipine. Was having low BPs, systolics of 92E and was regulalry feelin glightheaded. Recently saw nephrology,  pt has paper work with him that says to hold for systolic BP < 268. He has not taken it since, does have BP cuff at home, says BPs have been elevated. He no longer has lightheadedness. Has been feeling well.   CKD: s/p kidney transplant, follows at [facility]  Relevant past medical, surgical, family and social history reviewed. Allergies and medications reviewed and updated. Social History   Tobacco Use  Smoking Status Former Smoker  Smokeless Tobacco Architectural technologist  . Types: Chew   ROS: Per HPI   Objective:    BP (!) 186/102   Pulse 80   Temp 99.3 F (37.4 C) (Oral)   Ht 5\' 10"  (1.778 m)   Wt 147 lb (66.7 kg)   BMI 21.09 kg/m   Wt Readings from Last 3 Encounters:  12/16/17 147 lb (66.7 kg)  11/06/17 145 lb (65.8 kg)  06/22/15 165 lb (74.8 kg)    Gen: NAD, alert EYES: EOMI, no conjunctival injection, or no icterus ENT:   OP without erythema LYMPH: no cervical LAD CV: NRRR, normal S1/S2, no murmur, distal pulses 2+ b/l Resp: CTABL, no wheezes, normal WOB Abd: +BS, soft, NTND.  Ext: No edema, warm Neuro: Alert and oriented  Assessment & Plan:  Colton Todd was seen today for medical management of chronic issues.  Diagnoses and all orders for this visit:  Essential hypertension Not taking nifedipine, misunderstood directions. Was having low BPs on 60mg , will do trial of below, take daily, hold for BPs < 110. Reviewed with pt, pt able to teach back. -     NIFEdipine (PROCARDIA-XL/ADALAT-CC/NIFEDICAL-XL) 30 MG 24 hr tablet; Take 1 tablet (30 mg total) by mouth daily.   Follow up plan: Return in about 4 weeks (around 01/13/2018). Assunta Found, MD [place]

## 2017-12-16 NOTE — Patient Instructions (Signed)
Check blood pressures at home in the morning. If top number is less than 110, don't take nifedipine that day. If number is over 110 the next day, take nifedipine.

## 2017-12-21 ENCOUNTER — Encounter: Payer: Self-pay | Admitting: Pediatrics

## 2017-12-24 DIAGNOSIS — D631 Anemia in chronic kidney disease: Secondary | ICD-10-CM | POA: Diagnosis not present

## 2017-12-24 DIAGNOSIS — Z79899 Other long term (current) drug therapy: Secondary | ICD-10-CM | POA: Diagnosis not present

## 2018-01-02 DIAGNOSIS — N183 Chronic kidney disease, stage 3 (moderate): Secondary | ICD-10-CM | POA: Diagnosis not present

## 2018-01-10 ENCOUNTER — Telehealth: Payer: Self-pay | Admitting: Pediatrics

## 2018-01-10 NOTE — Telephone Encounter (Signed)
Much better, great.

## 2018-01-10 NOTE — Telephone Encounter (Signed)
Received BP log from home. Morning Bps 150s-170s, one low of 116.  Afternoons 100s-170s.  Check blood pressures at home in the morning. If top number is less than 110, don't take nifedipine that day. If number is over 110 the next day, take nifedipine.

## 2018-01-10 NOTE — Telephone Encounter (Signed)
Spoke with patient's wife.  Wife states that kidney doctor decreased nifedipine 30 mg 1/2 tab.  Bp readings today 147/78  139/79 with taking 15mg 

## 2018-01-17 ENCOUNTER — Ambulatory Visit (INDEPENDENT_AMBULATORY_CARE_PROVIDER_SITE_OTHER): Payer: BLUE CROSS/BLUE SHIELD | Admitting: Pediatrics

## 2018-01-17 ENCOUNTER — Encounter: Payer: Self-pay | Admitting: Pediatrics

## 2018-01-17 VITALS — BP 128/78 | HR 77 | Temp 97.9°F | Ht 70.0 in | Wt 144.2 lb

## 2018-01-17 DIAGNOSIS — M25511 Pain in right shoulder: Secondary | ICD-10-CM | POA: Diagnosis not present

## 2018-01-17 DIAGNOSIS — I10 Essential (primary) hypertension: Secondary | ICD-10-CM

## 2018-01-17 NOTE — Progress Notes (Signed)
  Subjective:   Patient ID: Colton Todd, male    DOB: Jun 19, 1950, 67 y.o.   MRN: [id number] CC: Blood Pressure Check and Shoulder Pain  HPI: Colton Todd is a 67 y.o. male   Here today with his wife.  Blood pressures have been improved taking 15mg  of nifedipine. Not having low numbers. No lightheadedness or dizziness. Brought BP log in from home. Mornings often 544B systolic, afternoons range up to 120s-140s. diastolics 20F-00F.  Shoulder started hurting yesterday. Doesn't think he did anything to hurt it, no change in activities. Has not had this pain before. No falls or injuries. Has not had shoulder pain like this before.  Relevant past medical, surgical, family and social history reviewed. Allergies and medications reviewed and updated. Social History   Tobacco Use  Smoking Status Former Smoker  Smokeless Tobacco Architectural technologist  . Types: Chew   ROS: Per HPI   Objective:    BP 128/78   Pulse 77   Temp 97.9 F (36.6 C) (Oral)   Ht 5\' 10"  (1.778 m)   Wt 144 lb 3.2 oz (65.4 kg)   BMI 20.69 kg/m   Wt Readings from Last 3 Encounters:  01/17/18 144 lb 3.2 oz (65.4 kg)  12/16/17 147 lb (66.7 kg)  11/06/17 145 lb (65.8 kg)    Gen: NAD, alert, cooperative with exam, NCAT EYES: EOMI, no conjunctival injection, or no icterus CV: NRRR, normal S1/S2 Resp: CTABL, no wheezes, normal WOB Abd: +BS, soft, NTND.  Ext: No edema, warm Neuro: Alert and oriented MSK: pain with active ROM R shoulder, decreased pain and improved ROM passively. Able to reach over head though hurts active ROM. No point tenderness. Some pain R shoulder with internal and ext rotation against resistance. Rotator cuff muscles intact.  Skin: shoulder nl to inspection, no redness or swelling  Assessment & Plan:  Tracer was seen today for blood pressure check and shoulder pain.  Diagnoses and all orders for this visit:  Essential hypertension Improved control. Cont current meds, cont to check  regularly.  Acute pain of right shoulder Started yesterday. Gentle ROM, avoid exacerbating activities, ice and heat as needed.  Follow up plan: Return in about 3 months (around 04/19/2018). Assunta Found, MD [place]

## 2018-01-22 ENCOUNTER — Encounter: Payer: Self-pay | Admitting: Pediatrics

## 2018-01-22 ENCOUNTER — Ambulatory Visit (INDEPENDENT_AMBULATORY_CARE_PROVIDER_SITE_OTHER): Payer: BLUE CROSS/BLUE SHIELD | Admitting: Pediatrics

## 2018-01-22 VITALS — BP 108/63 | HR 77 | Temp 97.3°F | Ht 70.0 in | Wt 143.0 lb

## 2018-01-22 DIAGNOSIS — I10 Essential (primary) hypertension: Secondary | ICD-10-CM | POA: Diagnosis not present

## 2018-01-22 DIAGNOSIS — R197 Diarrhea, unspecified: Secondary | ICD-10-CM

## 2018-01-22 DIAGNOSIS — Z1211 Encounter for screening for malignant neoplasm of colon: Secondary | ICD-10-CM | POA: Diagnosis not present

## 2018-01-22 NOTE — Progress Notes (Signed)
  Subjective:   Patient ID: Colton Todd, male    DOB: [birth year], 67 y.o.   MRN: [id number] CC: Diarrhea (Black )  HPI: LINZIE Todd is a 67 y.o. male with history of kidney transplant, chronic kidney disease.  Has had intermittent episodes of diarrhea going back several months.  Most recent episode started about 5 days ago.  No diarrhea yet today.  The last couple days he did have some black stools.  He has never had black stools with the diarrhea before.  No blood in his stools.  No abdominal pain, no nausea with episodes.  Diarrhea happens both in the day and at night.  About 5-6 times a day.  No fevers.  He says he has declined colonoscopies that have been due for preventive care recently but that he is interested in getting one now.  Blood pressures remain elevated in the morning, after he takes his medicine it comes down.  No lightheadedness or dizziness.  Taking nifedipine 15 mg now.  Relevant past medical, surgical, family and social history reviewed. Allergies and medications reviewed and updated. Social History   Tobacco Use  Smoking Status Former Smoker  Smokeless Tobacco Architectural technologist  . Types: Chew   ROS: Per HPI   Objective:    BP 108/63   Pulse 77   Temp (!) 97.3 F (36.3 C)   Ht _0  (1.778 m)   Wt 143 lb (64.9 kg)   BMI 20.52 kg/m   Wt Readings from Last 3 Encounters:  01/22/18 143 lb (64.9 kg)  01/17/18 144 lb 3.2 oz (65.4 kg)  12/16/17 147 lb (66.7 kg)   Gen: NAD, alert, cooperative with exam, NCAT EYES: EOMI, no conjunctival injection, or no icterus CV: NRRR, normal S1/S2, no murmur, distal pulses 2+ b/l Resp: CTABL, no wheezes, normal WOB Abd: +BS, soft, NTND. no guarding or organomegaly Ext: No edema, warm Neuro: Alert and oriented, strength equal b/l UE and LE, coordination grossly normal MSK: normal muscle bulk  Assessment & Plan:  Rebecca was seen today for diarrhea.  Diagnoses and all orders for this visit:  Diarrhea, unspecified  type Most recent episode resolved as of this morning, last diarrhea last night..  We will check blood work. -     CMP14+EGFR -     CBC with Differential/Platelet -     Ambulatory referral to Gastroenterology  Essential hypertension Improved control, continue current medicines.  Continue to follow at home.  Screen for colon cancer -     Ambulatory referral to Gastroenterology   Follow up plan: As scheduled Assunta Found, MD [place]

## 2018-01-23 ENCOUNTER — Encounter: Payer: Self-pay | Admitting: Pediatrics

## 2018-01-23 LAB — CMP14+EGFR
A/G RATIO: 1.7 (ref 1.2–2.2)
ALBUMIN: 4 g/dL (ref 3.6–4.8)
ALT: 18 IU/L (ref 0–44)
AST: 31 IU/L (ref 0–40)
Alkaline Phosphatase: 120 IU/L — ABNORMAL HIGH (ref 39–117)
BILIRUBIN TOTAL: 0.2 mg/dL (ref 0.0–1.2)
BUN / CREAT RATIO: 13 (ref 10–24)
BUN: 16 mg/dL (ref 8–27)
CHLORIDE: 107 mmol/L — AB (ref 96–106)
CO2: 21 mmol/L (ref 20–29)
Calcium: 9.7 mg/dL (ref 8.6–10.2)
Creatinine, Ser: 1.27 mg/dL (ref 0.76–1.27)
GFR calc non Af Amer: 59 mL/min/{1.73_m2} — ABNORMAL LOW (ref >59)
GFR, EST AFRICAN AMERICAN: 68 mL/min/{1.73_m2} (ref >59)
Globulin, Total: 2.4 g/dL (ref 1.5–4.5)
Glucose: 107 mg/dL — ABNORMAL HIGH (ref 65–99)
POTASSIUM: 3.3 mmol/L — AB (ref 3.5–5.2)
SODIUM: 144 mmol/L (ref 134–144)
TOTAL PROTEIN: 6.4 g/dL (ref 6.0–8.5)

## 2018-01-23 LAB — CBC WITH DIFFERENTIAL/PLATELET
BASOS ABS: 0.1 10*3/uL (ref 0.0–0.2)
Basos: 1 %
EOS (ABSOLUTE): 0.2 10*3/uL (ref 0.0–0.4)
Eos: 3 %
HEMOGLOBIN: 9.4 g/dL — AB (ref 13.0–17.7)
Hematocrit: 28.5 % — ABNORMAL LOW (ref 37.5–51.0)
Immature Grans (Abs): 0 10*3/uL (ref 0.0–0.1)
Immature Granulocytes: 0 %
LYMPHS ABS: 1.1 10*3/uL (ref 0.7–3.1)
Lymphs: 16 %
MCH: 27.2 pg (ref 26.6–33.0)
MCHC: 33 g/dL (ref 31.5–35.7)
MCV: 82 fL (ref 79–97)
Monocytes Absolute: 0.9 10*3/uL (ref 0.1–0.9)
Monocytes: 12 %
NEUTROS ABS: 4.9 10*3/uL (ref 1.4–7.0)
Neutrophils: 68 %
PLATELETS: 279 10*3/uL (ref 150–450)
RBC: 3.46 x10E6/uL — ABNORMAL LOW (ref 4.14–5.80)
RDW: 15.8 % — ABNORMAL HIGH (ref 12.3–15.4)
WBC: 7.2 10*3/uL (ref 3.4–10.8)

## 2018-01-24 DIAGNOSIS — R197 Diarrhea, unspecified: Secondary | ICD-10-CM | POA: Diagnosis not present

## 2018-01-31 ENCOUNTER — Telehealth: Payer: Self-pay | Admitting: Pediatrics

## 2018-01-31 NOTE — Telephone Encounter (Signed)
Line Busy x 3

## 2018-02-04 NOTE — Telephone Encounter (Signed)
Left message to call back  

## 2018-02-05 DIAGNOSIS — R194 Change in bowel habit: Secondary | ICD-10-CM | POA: Diagnosis not present

## 2018-02-05 DIAGNOSIS — T8619 Other complication of kidney transplant: Secondary | ICD-10-CM | POA: Diagnosis not present

## 2018-02-05 DIAGNOSIS — Z940 Kidney transplant status: Secondary | ICD-10-CM | POA: Diagnosis not present

## 2018-02-05 DIAGNOSIS — N186 End stage renal disease: Secondary | ICD-10-CM | POA: Diagnosis not present

## 2018-02-10 NOTE — Telephone Encounter (Signed)
Closing encounter

## 2018-02-21 DIAGNOSIS — K648 Other hemorrhoids: Secondary | ICD-10-CM | POA: Diagnosis not present

## 2018-02-21 DIAGNOSIS — K5730 Diverticulosis of large intestine without perforation or abscess without bleeding: Secondary | ICD-10-CM | POA: Diagnosis not present

## 2018-02-21 DIAGNOSIS — K529 Noninfective gastroenteritis and colitis, unspecified: Secondary | ICD-10-CM | POA: Diagnosis not present

## 2018-02-21 DIAGNOSIS — K56699 Other intestinal obstruction unspecified as to partial versus complete obstruction: Secondary | ICD-10-CM | POA: Diagnosis not present

## 2018-02-21 DIAGNOSIS — R197 Diarrhea, unspecified: Secondary | ICD-10-CM | POA: Diagnosis not present

## 2018-02-21 DIAGNOSIS — R194 Change in bowel habit: Secondary | ICD-10-CM | POA: Diagnosis not present

## 2018-02-21 DIAGNOSIS — Z1211 Encounter for screening for malignant neoplasm of colon: Secondary | ICD-10-CM | POA: Diagnosis not present

## 2018-02-24 ENCOUNTER — Other Ambulatory Visit: Payer: Self-pay

## 2018-02-24 NOTE — Progress Notes (Signed)
Patient returned call after we had made several attempts to contact him.  He wanted to let you know that he did see the GI and had colonoscopy last week.  Is waiting on them to contact him with results.  He thinks they removed a polyp and sent it off to be biopsied.

## 2018-02-24 NOTE — Progress Notes (Unsigned)
Erroneous encounter

## 2018-03-03 DIAGNOSIS — L578 Other skin changes due to chronic exposure to nonionizing radiation: Secondary | ICD-10-CM | POA: Diagnosis not present

## 2018-03-03 DIAGNOSIS — C44619 Basal cell carcinoma of skin of left upper limb, including shoulder: Secondary | ICD-10-CM | POA: Diagnosis not present

## 2018-03-03 DIAGNOSIS — C44319 Basal cell carcinoma of skin of other parts of face: Secondary | ICD-10-CM | POA: Diagnosis not present

## 2018-03-03 DIAGNOSIS — C4441 Basal cell carcinoma of skin of scalp and neck: Secondary | ICD-10-CM | POA: Diagnosis not present

## 2018-03-03 DIAGNOSIS — C44219 Basal cell carcinoma of skin of left ear and external auricular canal: Secondary | ICD-10-CM | POA: Diagnosis not present

## 2018-03-03 DIAGNOSIS — L739 Follicular disorder, unspecified: Secondary | ICD-10-CM | POA: Diagnosis not present

## 2018-03-12 DIAGNOSIS — N183 Chronic kidney disease, stage 3 (moderate): Secondary | ICD-10-CM | POA: Diagnosis not present

## 2018-03-12 DIAGNOSIS — D649 Anemia, unspecified: Secondary | ICD-10-CM | POA: Diagnosis not present

## 2018-03-12 DIAGNOSIS — N186 End stage renal disease: Secondary | ICD-10-CM | POA: Diagnosis not present

## 2018-03-12 DIAGNOSIS — T8619 Other complication of kidney transplant: Secondary | ICD-10-CM | POA: Diagnosis not present

## 2018-03-12 DIAGNOSIS — M2550 Pain in unspecified joint: Secondary | ICD-10-CM | POA: Diagnosis not present

## 2018-03-13 DIAGNOSIS — K8020 Calculus of gallbladder without cholecystitis without obstruction: Secondary | ICD-10-CM | POA: Diagnosis not present

## 2018-03-13 DIAGNOSIS — K838 Other specified diseases of biliary tract: Secondary | ICD-10-CM | POA: Diagnosis not present

## 2018-03-13 DIAGNOSIS — K7689 Other specified diseases of liver: Secondary | ICD-10-CM | POA: Diagnosis not present

## 2018-03-13 DIAGNOSIS — N281 Cyst of kidney, acquired: Secondary | ICD-10-CM | POA: Diagnosis not present

## 2018-03-13 DIAGNOSIS — K529 Noninfective gastroenteritis and colitis, unspecified: Secondary | ICD-10-CM | POA: Diagnosis not present

## 2018-03-13 DIAGNOSIS — R197 Diarrhea, unspecified: Secondary | ICD-10-CM | POA: Diagnosis not present

## 2018-03-19 DIAGNOSIS — C44612 Basal cell carcinoma of skin of right upper limb, including shoulder: Secondary | ICD-10-CM | POA: Diagnosis not present

## 2018-03-19 DIAGNOSIS — C44219 Basal cell carcinoma of skin of left ear and external auricular canal: Secondary | ICD-10-CM | POA: Diagnosis not present

## 2018-03-19 DIAGNOSIS — C44319 Basal cell carcinoma of skin of other parts of face: Secondary | ICD-10-CM | POA: Diagnosis not present

## 2018-03-19 DIAGNOSIS — C4441 Basal cell carcinoma of skin of scalp and neck: Secondary | ICD-10-CM | POA: Diagnosis not present

## 2018-03-19 DIAGNOSIS — C44619 Basal cell carcinoma of skin of left upper limb, including shoulder: Secondary | ICD-10-CM | POA: Diagnosis not present

## 2018-03-27 DIAGNOSIS — Z483 Aftercare following surgery for neoplasm: Secondary | ICD-10-CM | POA: Diagnosis not present

## 2018-04-03 DIAGNOSIS — Z4802 Encounter for removal of sutures: Secondary | ICD-10-CM | POA: Diagnosis not present

## 2018-04-23 ENCOUNTER — Ambulatory Visit (INDEPENDENT_AMBULATORY_CARE_PROVIDER_SITE_OTHER): Payer: BLUE CROSS/BLUE SHIELD | Admitting: Family Medicine

## 2018-04-23 ENCOUNTER — Ambulatory Visit: Payer: Medicare Other | Admitting: Pediatrics

## 2018-04-23 ENCOUNTER — Encounter: Payer: Self-pay | Admitting: Family Medicine

## 2018-04-23 VITALS — BP 138/75 | HR 87 | Temp 96.8°F | Ht 70.0 in | Wt 152.0 lb

## 2018-04-23 DIAGNOSIS — C4491 Basal cell carcinoma of skin, unspecified: Secondary | ICD-10-CM | POA: Insufficient documentation

## 2018-04-23 DIAGNOSIS — I10 Essential (primary) hypertension: Secondary | ICD-10-CM

## 2018-04-23 DIAGNOSIS — Z87718 Personal history of other specified (corrected) congenital malformations of genitourinary system: Secondary | ICD-10-CM

## 2018-04-23 DIAGNOSIS — Z940 Kidney transplant status: Secondary | ICD-10-CM

## 2018-04-23 NOTE — Patient Instructions (Signed)
Your blood pressure looks good.  See me in 6 months for check up or sooner if needed.   Have your specialists send me your notes since Dr Evette Doffing will be out for a while.

## 2018-04-23 NOTE — Progress Notes (Signed)
Subjective: CC: HTN PCP: Colton Maize, MD OEV:[id number] [name] is a 68 y.o. male presenting to clinic today for:  1. HTN/chronic kidney disease/basal cell carcinoma Patient is a patient of Dr. Autumn Todd.  He resides at home with his wife and has many family members that live locally.  He has 2 adopted children that live locally as well.  Patient had labs performed with his nephrologist in December which revealed improvement in hemoglobin 11.9 and improvement in potassium level to 3.6.  Creatinine at that check was 1.81.  Per nephrology report ESRD due to PKD.  Baseline creatinine tends to be around 1.2-1.4.  He has history of transplant.  He is on immunosuppressants for this.  He is also on an ARB.  His recommend that he continue his allopurinol prophylaxis for gout.  And it is been recommended against taking indomethacin.  He was referred to rheumatology for joint pain as the joint pain that he was noting did not seem consistent with gout.  Pertaining to his hypertension it was recommended that he continue losartan 100 mg daily and metoprolol 25 mg twice daily.  He is replaced nifedipine with amlodipine 2.5 mg and recommended titration up as needed high blood pressure.  He reports that medication was ultimately increased to 5 mg p.o. twice daily and he has had good control of blood pressures at home with this.  He has follow-up in March with nephrology.   He notes that he had basal cell cancers cut off of him.  These are thought to be a result of previous medications for his kidney transplant.  He has not seen the rheumatologist yet but does have an appointment in February.  He notes joint pain in the wrists, elbows and hands.  He thinks this is gout but per the nephrologist report uric acid level was appropriate and unlikely to be causing his symptoms.  He takes Lyrica for chronic pain as well as Valium for pain and sleep.  These are prescribed by his nephrologist.  He also reports intermittent  use of marijuana "from a reputable source".  ROS: Per HPI  Allergies  Allergen Reactions  . Codeine Itching  . Hydrocodone Itching   Past Medical History:  Diagnosis Date  . Chronic kidney disease   . Hypertension     Current Outpatient Medications:  .  allopurinol (ZYLOPRIM) 100 MG tablet, Take 100 mg by mouth 2 (two) times daily., Disp: , Rfl:  .  amLODipine (NORVASC) 5 MG tablet, Take by mouth., Disp: , Rfl:  .  diazepam (VALIUM) 5 MG tablet, TAKE 1 TABLET BY MOUTH EVERY 8 HOURS AS NEEDED FOR ANXIETY OR SLEEP, Disp: , Rfl:  .  fluticasone (FLONASE) 50 MCG/ACT nasal spray, Place 2 sprays into both nostrils daily., Disp: 16 g, Rfl: 6 .  K Phos Mono-Sod Phos Di & Mono (PHOSPHA 250 NEUTRAL) 155-852-130 MG TABS, Take 1 tablet by mouth 2 (two) times daily., Disp: , Rfl: 11 .  losartan (COZAAR) 100 MG tablet, Take 100 mg by mouth daily., Disp: , Rfl:  .  metoprolol tartrate (LOPRESSOR) 25 MG tablet, Take 25 mg by mouth 2 (two) times daily., Disp: , Rfl:  .  mycophenolate (MYFORTIC) 360 MG TBEC EC tablet, TAKE 1 TABLET BY MOUTH TWICE A DAY, Disp: , Rfl:  .  NIFEdipine (PROCARDIA-XL/ADALAT-CC/NIFEDICAL-XL) 30 MG 24 hr tablet, Take 1 tablet (30 mg total) by mouth daily. (Patient taking differently: Take 15 mg by mouth daily. ), Disp: 90 tablet, Rfl: 1 .  potassium chloride (K-DUR) 10 MEQ tablet, TAKE TWO TABLETS (20 MEQ DOSE) BY MOUTH DAILY FOR 5 DAYS., Disp: , Rfl: 0 .  pregabalin (LYRICA) 50 MG capsule, Take 50 mg by mouth 3 (three) times daily., Disp: , Rfl:  .  ranitidine (ZANTAC) 150 MG capsule, Take by mouth., Disp: , Rfl:  .  Sirolimus (RAPAMUNE) 0.5 MG tablet, Take by mouth., Disp: , Rfl:  .  sodium bicarbonate 650 MG tablet, Take 650 mg by mouth 4 (four) times daily., Disp: , Rfl:  .  terazosin (HYTRIN) 1 MG capsule, Take by mouth., Disp: , Rfl:  Social History   Socioeconomic History  . Marital status: Married    Spouse name: Not on file  . Number of children: Not on file  .  Years of education: Not on file  . Highest education level: Not on file  Occupational History  . Not on file  Social Needs  . Financial resource strain: Not on file  . Food insecurity:    Worry: Not on file    Inability: Not on file  . Transportation needs:    Medical: Not on file    Non-medical: Not on file  Tobacco Use  . Smoking status: Former Research scientist (life sciences)  . Smokeless tobacco: Current User    Types: Chew  Substance and Sexual Activity  . Alcohol use: Yes  . Drug use: No  . Sexual activity: Not on file  Lifestyle  . Physical activity:    Days per week: Not on file    Minutes per session: Not on file  . Stress: Not on file  Relationships  . Social connections:    Talks on phone: Not on file    Gets together: Not on file    Attends religious service: Not on file    Active member of club or organization: Not on file    Attends meetings of clubs or organizations: Not on file    Relationship status: Not on file  . Intimate partner violence:    Fear of current or ex partner: Not on file    Emotionally abused: Not on file    Physically abused: Not on file    Forced sexual activity: Not on file  Other Topics Concern  . Not on file  Social History Narrative  . Not on file   Family History  Problem Relation Age of Onset  . Heart disease Mother   . Cancer Mother   . Heart disease Father   . Kidney disease Father     Objective: Office vital signs reviewed. BP 138/75   Pulse 87   Temp (!) 96.8 F (36 C) (Oral)   Ht 5\' 10"  (1.778 m)   Wt 152 lb (68.9 kg)   BMI 21.81 kg/m   Physical Examination:  General: Awake, alert, chronically ill appearing, No acute distress HEENT: Normal, sclera white. Cardio: regular rate and rhythm, S1S2 heard, no murmurs appreciated Pulm: clear to auscultation bilaterally, no wheezes, rhonchi or rales; normal work of breathing on room air Extremities: warm, well perfused, 1+ pitting edema to mid shins.  No cyanosis or clubbing; +2 pulses  bilaterally Skin: Several areas of hypopigmentation.  He has quite a bit of scarring along bilateral upper extremities from previous surgical procedures.  Assessment/ Plan: 68 y.o. male   1. Essential hypertension Under good control.  Continue current regimen.  2. History of renal transplant I reviewed his nephrologist note, recent lab reports.  Potassium has gone back to normal.  Hemoglobin  improved.  I have documented the change in his blood pressure medication.  We will continue to follow along closely with her current recommendations.  3. History of polycystic kidney disease As above  4.  Basal cell carcinoma (BCC), unspecified site Recently saw dermatology and continues to follow-up with them intermittently.   No orders of the defined types were placed in this encounter.  No orders of the defined types were placed in this encounter.   Janora Norlander, DO [place] [phone]

## 2018-04-30 ENCOUNTER — Encounter: Payer: Self-pay | Admitting: Family Medicine

## 2018-04-30 ENCOUNTER — Ambulatory Visit (INDEPENDENT_AMBULATORY_CARE_PROVIDER_SITE_OTHER): Payer: BLUE CROSS/BLUE SHIELD | Admitting: Family Medicine

## 2018-04-30 VITALS — BP 119/63 | HR 65 | Temp 96.9°F | Ht 70.0 in | Wt 149.5 lb

## 2018-04-30 DIAGNOSIS — D492 Neoplasm of unspecified behavior of bone, soft tissue, and skin: Secondary | ICD-10-CM | POA: Diagnosis not present

## 2018-04-30 DIAGNOSIS — J705 Respiratory conditions due to smoke inhalation: Secondary | ICD-10-CM

## 2018-04-30 DIAGNOSIS — Z940 Kidney transplant status: Secondary | ICD-10-CM | POA: Diagnosis not present

## 2018-04-30 DIAGNOSIS — T59811A Toxic effect of smoke, accidental (unintentional), initial encounter: Secondary | ICD-10-CM
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
## 2018-05-06 DIAGNOSIS — M10371 Gout due to renal impairment, right ankle and foot: Secondary | ICD-10-CM | POA: Diagnosis not present

## 2018-05-06 DIAGNOSIS — M2550 Pain in unspecified joint: Secondary | ICD-10-CM | POA: Diagnosis not present

## 2018-05-06 DIAGNOSIS — N183 Chronic kidney disease, stage 3 (moderate): Secondary | ICD-10-CM | POA: Diagnosis not present

## 2018-05-06 DIAGNOSIS — N186 End stage renal disease: Secondary | ICD-10-CM | POA: Diagnosis not present

## 2018-05-06 DIAGNOSIS — G8929 Other chronic pain: Secondary | ICD-10-CM | POA: Diagnosis not present

## 2018-05-06 DIAGNOSIS — K56699 Other intestinal obstruction unspecified as to partial versus complete obstruction: Secondary | ICD-10-CM | POA: Diagnosis not present

## 2018-05-06 DIAGNOSIS — M1A3710 Chronic gout due to renal impairment, right ankle and foot, without tophus (tophi): Secondary | ICD-10-CM | POA: Diagnosis not present

## 2018-05-06 DIAGNOSIS — T8619 Other complication of kidney transplant: Secondary | ICD-10-CM | POA: Diagnosis not present

## 2018-05-20 ENCOUNTER — Other Ambulatory Visit: Payer: Self-pay | Admitting: Pediatrics

## 2018-05-20 DIAGNOSIS — M7732 Calcaneal spur, left foot: Secondary | ICD-10-CM | POA: Diagnosis not present

## 2018-05-20 DIAGNOSIS — N186 End stage renal disease: Secondary | ICD-10-CM | POA: Diagnosis not present

## 2018-05-20 DIAGNOSIS — R7982 Elevated C-reactive protein (CRP): Secondary | ICD-10-CM | POA: Diagnosis not present

## 2018-05-20 DIAGNOSIS — N183 Chronic kidney disease, stage 3 (moderate): Secondary | ICD-10-CM | POA: Diagnosis not present

## 2018-05-20 DIAGNOSIS — J069 Acute upper respiratory infection, unspecified: Secondary | ICD-10-CM

## 2018-05-20 DIAGNOSIS — M79642 Pain in left hand: Secondary | ICD-10-CM | POA: Diagnosis not present

## 2018-05-20 DIAGNOSIS — R768 Other specified abnormal immunological findings in serum: Secondary | ICD-10-CM | POA: Diagnosis not present

## 2018-05-20 DIAGNOSIS — M7731 Calcaneal spur, right foot: Secondary | ICD-10-CM | POA: Diagnosis not present

## 2018-05-20 DIAGNOSIS — M1A3710 Chronic gout due to renal impairment, right ankle and foot, without tophus (tophi): Secondary | ICD-10-CM | POA: Diagnosis not present

## 2018-05-20 DIAGNOSIS — M79672 Pain in left foot: Secondary | ICD-10-CM | POA: Diagnosis not present

## 2018-05-20 DIAGNOSIS — M79641 Pain in right hand: Secondary | ICD-10-CM | POA: Diagnosis not present

## 2018-05-20 DIAGNOSIS — M79671 Pain in right foot: Secondary | ICD-10-CM | POA: Diagnosis not present

## 2018-05-20 DIAGNOSIS — T8619 Other complication of kidney transplant: Secondary | ICD-10-CM | POA: Diagnosis not present

## 2018-05-20 DIAGNOSIS — M064 Inflammatory polyarthropathy: Secondary | ICD-10-CM | POA: Diagnosis not present

## 2018-06-03 DIAGNOSIS — N183 Chronic kidney disease, stage 3 (moderate): Secondary | ICD-10-CM | POA: Diagnosis not present

## 2018-06-03 DIAGNOSIS — M1A3710 Chronic gout due to renal impairment, right ankle and foot, without tophus (tophi): Secondary | ICD-10-CM | POA: Diagnosis not present

## 2018-06-12 DIAGNOSIS — T8619 Other complication of kidney transplant: Secondary | ICD-10-CM | POA: Diagnosis not present

## 2018-06-12 DIAGNOSIS — N186 End stage renal disease: Secondary | ICD-10-CM | POA: Diagnosis not present

## 2018-06-12 DIAGNOSIS — Z79899 Other long term (current) drug therapy: Secondary | ICD-10-CM | POA: Diagnosis not present

## 2018-06-17 DIAGNOSIS — Z79899 Other long term (current) drug therapy: Secondary | ICD-10-CM | POA: Diagnosis not present

## 2018-06-17 DIAGNOSIS — T8619 Other complication of kidney transplant: Secondary | ICD-10-CM | POA: Diagnosis not present

## 2018-06-17 DIAGNOSIS — N186 End stage renal disease: Secondary | ICD-10-CM | POA: Diagnosis not present

## 2018-09-09 DIAGNOSIS — N183 Chronic kidney disease, stage 3 (moderate): Secondary | ICD-10-CM | POA: Diagnosis not present

## 2018-09-09 DIAGNOSIS — M1A3710 Chronic gout due to renal impairment, right ankle and foot, without tophus (tophi): Secondary | ICD-10-CM | POA: Diagnosis not present

## 2018-09-09 DIAGNOSIS — T8619 Other complication of kidney transplant: Secondary | ICD-10-CM | POA: Diagnosis not present

## 2018-09-09 DIAGNOSIS — M064 Inflammatory polyarthropathy: Secondary | ICD-10-CM | POA: Diagnosis not present

## 2018-09-09 DIAGNOSIS — N186 End stage renal disease: Secondary | ICD-10-CM | POA: Diagnosis not present

## 2018-09-17 DIAGNOSIS — Z940 Kidney transplant status: Secondary | ICD-10-CM | POA: Diagnosis not present

## 2018-09-17 DIAGNOSIS — T8619 Other complication of kidney transplant: Secondary | ICD-10-CM | POA: Diagnosis not present

## 2018-09-17 DIAGNOSIS — Z79899 Other long term (current) drug therapy: Secondary | ICD-10-CM | POA: Diagnosis not present

## 2018-09-17 DIAGNOSIS — N186 End stage renal disease: Secondary | ICD-10-CM | POA: Diagnosis not present

## 2018-11-10 ENCOUNTER — Other Ambulatory Visit: Payer: Self-pay

## 2018-11-10 DIAGNOSIS — T8619 Other complication of kidney transplant: Secondary | ICD-10-CM | POA: Diagnosis not present

## 2018-11-10 DIAGNOSIS — N186 End stage renal disease: Secondary | ICD-10-CM | POA: Diagnosis not present

## 2018-11-10 DIAGNOSIS — L98499 Non-pressure chronic ulcer of skin of other sites with unspecified severity: Secondary | ICD-10-CM | POA: Diagnosis not present

## 2018-11-10 DIAGNOSIS — N183 Chronic kidney disease, stage 3 (moderate): Secondary | ICD-10-CM | POA: Diagnosis not present

## 2018-11-10 DIAGNOSIS — M064 Inflammatory polyarthropathy: Secondary | ICD-10-CM | POA: Diagnosis not present

## 2018-11-10 DIAGNOSIS — R768 Other specified abnormal immunological findings in serum: Secondary | ICD-10-CM | POA: Diagnosis not present

## 2018-11-11 ENCOUNTER — Encounter: Payer: Self-pay | Admitting: Family Medicine

## 2018-11-11 ENCOUNTER — Telehealth: Payer: Self-pay | Admitting: Family Medicine

## 2018-11-11 ENCOUNTER — Ambulatory Visit (INDEPENDENT_AMBULATORY_CARE_PROVIDER_SITE_OTHER): Payer: BC Managed Care – PPO | Admitting: Family Medicine

## 2018-11-11 VITALS — BP 122/73 | HR 69 | Temp 98.6°F | Ht 70.0 in | Wt 151.0 lb

## 2018-11-11 DIAGNOSIS — L0390 Cellulitis, unspecified: Secondary | ICD-10-CM | POA: Diagnosis not present

## 2018-11-11 MED ORDER — AMOXICILLIN 875 MG PO TABS: 875.0000 mg | ORAL_TABLET | Freq: Two times a day (BID) | ORAL | 0 refills | Status: AC

## 2018-11-11 MED ORDER — MUPIROCIN 2 % EX OINT: TOPICAL_OINTMENT | CUTANEOUS | 1 refills | Status: AC

## 2018-11-11 NOTE — Telephone Encounter (Signed)
Yes

## 2018-11-11 NOTE — Progress Notes (Signed)
Chief Complaint  Patient presents with  . finger infection    left middle finger    HPI  Patient presents today for open wound at the left third finger.  PMH: Smoking status noted ROS: Per HPI  Objective: BP 122/73   Pulse 69   Temp 98.6 F (37 C)   Ht 5\' 10"  (1.778 m)   Wt 151 lb (68.5 kg)   BMI 21.67 kg/m  Gen: NAD, alert, cooperative with exam HEENT: NCAT, EOMI, PERRL CV: RRR, good S1/S2, no murmur Resp: CTABL, no wheezes, non-labored Skin: there is a lesion 46mm X 2 mm at the base of the dorsal left 3rd finger. There is minimal erythema, moderate edema. No purulence. Some guarding for ROM at MCP. Ext: No edema, warm Neuro: Alert and oriented, No gross deficits  Assessment and plan:  1. Wound cellulitis     Meds ordered this encounter  Medications  . amoxicillin (AMOXIL) 875 MG tablet    Sig: Take 1 tablet (875 mg total) by mouth 2 (two) times daily for 10 days.    Dispense:  20 tablet    Refill:  0  . mupirocin ointment (BACTROBAN) 2 %    Sig: Apply and cover with bandage twice daily    Dispense:  30 g    Refill:  1    No orders of the defined types were placed in this encounter.   Follow up as needed.  [name] Fraise, MD

## 2018-11-11 NOTE — Telephone Encounter (Signed)
Fist available 8/21 acute day

## 2018-11-20 NOTE — Telephone Encounter (Signed)
Multiple attempts made to contact patient.  This encounter will now be closed  

## 2018-11-26 ENCOUNTER — Other Ambulatory Visit: Payer: Self-pay

## 2018-11-26 ENCOUNTER — Encounter: Payer: Self-pay | Admitting: Family Medicine

## 2018-11-26 ENCOUNTER — Ambulatory Visit (INDEPENDENT_AMBULATORY_CARE_PROVIDER_SITE_OTHER): Payer: BC Managed Care – PPO | Admitting: Family Medicine

## 2018-11-26 VITALS — BP 134/74 | HR 93 | Temp 99.1°F | Ht 70.0 in | Wt 151.0 lb

## 2018-11-26 DIAGNOSIS — M7989 Other specified soft tissue disorders: Secondary | ICD-10-CM

## 2018-11-26 NOTE — Progress Notes (Signed)
Chief Complaint  Patient presents with  . Left foot swollen    Wants indomethacin    HPI  Patient presents today for pain at the left heel radiating to the plantar and dorsal midfoot. Has had similar in past. Given indomethacin with good result. Wants the same med today. Understands its toxicity. His orthopedist is concerned for gout. Pt. Requests a uric acid test.  PMH: Smoking status noted ROS: Per HPI  Objective: BP 134/74   Pulse 93   Temp 99.1 F (37.3 C) (Oral)   Ht _0  (1.778 m)   Wt 151 lb (68.5 kg)   BMI 21.67 kg/m  Gen: NAD, alert, cooperative with exam HEENT: NCAT, EOMI, PERRL CV: RRR, good S1/S2, no murmur Resp: CTABL, no wheezes, non-labored Ext: No edema, warm. The left foot has 2+ edema. Minimal erythema at the heel and tenderness for palpation. Pt. Declines to bear weight. Is in wheelchair.  Neuro: Alert and oriented, No gross deficits  Assessment and plan:  1. Foot swelling     Meds ordered this encounter  Medications  . indomethacin (INDOCIN) 50 MG capsule    Sig: Take 1 capsule (50 mg total) by mouth 3 (three) times daily with meals for 2 days.    Dispense:  6 capsule    Refill:  0    Orders Placed This Encounter  Procedures  . CMP14+EGFR  . Uric acid    Follow up as needed.  [name] Fraise, MD

## 2018-11-27 ENCOUNTER — Encounter: Payer: Self-pay | Admitting: Family Medicine

## 2018-11-27 DIAGNOSIS — M13872 Other specified arthritis, left ankle and foot: Secondary | ICD-10-CM | POA: Diagnosis not present

## 2018-11-27 DIAGNOSIS — Z87891 Personal history of nicotine dependence: Secondary | ICD-10-CM | POA: Diagnosis not present

## 2018-11-27 DIAGNOSIS — M79672 Pain in left foot: Secondary | ICD-10-CM | POA: Diagnosis not present

## 2018-11-27 DIAGNOSIS — Z79899 Other long term (current) drug therapy: Secondary | ICD-10-CM | POA: Diagnosis not present

## 2018-11-27 DIAGNOSIS — Z8639 Personal history of other endocrine, nutritional and metabolic disease: Secondary | ICD-10-CM | POA: Diagnosis not present

## 2018-11-27 DIAGNOSIS — M7989 Other specified soft tissue disorders: Secondary | ICD-10-CM | POA: Diagnosis not present

## 2018-11-27 DIAGNOSIS — I10 Essential (primary) hypertension: Secondary | ICD-10-CM | POA: Diagnosis not present

## 2018-11-27 DIAGNOSIS — Z885 Allergy status to narcotic agent status: Secondary | ICD-10-CM | POA: Diagnosis not present

## 2018-11-27 DIAGNOSIS — Z940 Kidney transplant status: Secondary | ICD-10-CM | POA: Diagnosis not present

## 2018-11-27 DIAGNOSIS — R600 Localized edema: Secondary | ICD-10-CM | POA: Diagnosis not present

## 2018-11-27 DIAGNOSIS — M19072 Primary osteoarthritis, left ankle and foot: Secondary | ICD-10-CM | POA: Diagnosis not present

## 2018-11-27 LAB — CMP14+EGFR
ALT: 12 IU/L (ref 0–44)
AST: 25 IU/L (ref 0–40)
Albumin/Globulin Ratio: 1.5 (ref 1.2–2.2)
Albumin: 3.8 g/dL (ref 3.8–4.8)
Alkaline Phosphatase: 88 IU/L (ref 39–117)
BUN/Creatinine Ratio: 13 (ref 10–24)
BUN: 19 mg/dL (ref 8–27)
Bilirubin Total: 0.5 mg/dL (ref 0.0–1.2)
CO2: 21 mmol/L (ref 20–29)
Calcium: 10 mg/dL (ref 8.6–10.2)
Chloride: 105 mmol/L (ref 96–106)
Creatinine, Ser: 1.48 mg/dL — ABNORMAL HIGH (ref 0.76–1.27)
GFR calc Af Amer: 56 mL/min/{1.73_m2} — ABNORMAL LOW (ref >59)
GFR calc non Af Amer: 49 mL/min/{1.73_m2} — ABNORMAL LOW (ref >59)
Globulin, Total: 2.5 g/dL (ref 1.5–4.5)
Glucose: 116 mg/dL — ABNORMAL HIGH (ref 65–99)
Potassium: 3.8 mmol/L (ref 3.5–5.2)
Sodium: 142 mmol/L (ref 134–144)
Total Protein: 6.3 g/dL (ref 6.0–8.5)

## 2018-11-27 LAB — URIC ACID: Uric Acid: 4.1 mg/dL (ref 3.7–8.6)

## 2018-11-27 MED ORDER — INDOMETHACIN 50 MG PO CAPS: 50.0000 mg | ORAL_CAPSULE | Freq: Three times a day (TID) | ORAL | 0 refills | Status: AC

## 2018-12-18 DIAGNOSIS — Z940 Kidney transplant status: Secondary | ICD-10-CM | POA: Diagnosis not present

## 2018-12-18 DIAGNOSIS — T8619 Other complication of kidney transplant: Secondary | ICD-10-CM | POA: Diagnosis not present

## 2018-12-18 DIAGNOSIS — Z79899 Other long term (current) drug therapy: Secondary | ICD-10-CM | POA: Diagnosis not present

## 2018-12-18 DIAGNOSIS — N186 End stage renal disease: Secondary | ICD-10-CM | POA: Diagnosis not present

## 2018-12-23 DIAGNOSIS — T8619 Other complication of kidney transplant: Secondary | ICD-10-CM | POA: Diagnosis not present

## 2018-12-23 DIAGNOSIS — M0579 Rheumatoid arthritis with rheumatoid factor of multiple sites without organ or systems involvement: Secondary | ICD-10-CM | POA: Diagnosis not present

## 2018-12-23 DIAGNOSIS — M1A3710 Chronic gout due to renal impairment, right ankle and foot, without tophus (tophi): Secondary | ICD-10-CM | POA: Diagnosis not present

## 2018-12-23 DIAGNOSIS — N186 End stage renal disease: Secondary | ICD-10-CM | POA: Diagnosis not present

## 2018-12-23 DIAGNOSIS — N183 Chronic kidney disease, stage 3 (moderate): Secondary | ICD-10-CM | POA: Diagnosis not present

## 2019-01-12 DIAGNOSIS — N281 Cyst of kidney, acquired: Secondary | ICD-10-CM | POA: Diagnosis not present

## 2019-01-12 DIAGNOSIS — Z940 Kidney transplant status: Secondary | ICD-10-CM | POA: Diagnosis not present

## 2019-01-12 DIAGNOSIS — R93421 Abnormal radiologic findings on diagnostic imaging of right kidney: Secondary | ICD-10-CM | POA: Diagnosis not present

## 2019-01-12 DIAGNOSIS — Q612 Polycystic kidney, adult type: Secondary | ICD-10-CM | POA: Diagnosis not present

## 2019-01-12 DIAGNOSIS — Z23 Encounter for immunization: Secondary | ICD-10-CM | POA: Diagnosis not present

## 2019-02-04 DIAGNOSIS — M1A3710 Chronic gout due to renal impairment, right ankle and foot, without tophus (tophi): Secondary | ICD-10-CM | POA: Diagnosis not present

## 2019-02-04 DIAGNOSIS — T8619 Other complication of kidney transplant: Secondary | ICD-10-CM | POA: Diagnosis not present

## 2019-02-04 DIAGNOSIS — Z79899 Other long term (current) drug therapy: Secondary | ICD-10-CM | POA: Diagnosis not present

## 2019-02-04 DIAGNOSIS — M0579 Rheumatoid arthritis with rheumatoid factor of multiple sites without organ or systems involvement: Secondary | ICD-10-CM | POA: Diagnosis not present

## 2019-02-04 DIAGNOSIS — N186 End stage renal disease: Secondary | ICD-10-CM | POA: Diagnosis not present

## 2019-02-04 DIAGNOSIS — M05732 Rheumatoid arthritis with rheumatoid factor of left wrist without organ or systems involvement: Secondary | ICD-10-CM | POA: Diagnosis not present

## 2019-02-11 DIAGNOSIS — N186 End stage renal disease: Secondary | ICD-10-CM | POA: Diagnosis not present

## 2019-02-11 DIAGNOSIS — N2889 Other specified disorders of kidney and ureter: Secondary | ICD-10-CM | POA: Diagnosis not present

## 2019-02-11 DIAGNOSIS — T8619 Other complication of kidney transplant: Secondary | ICD-10-CM | POA: Diagnosis not present

## 2019-02-17 ENCOUNTER — Other Ambulatory Visit: Payer: Self-pay

## 2019-02-17 DIAGNOSIS — Z20822 Contact with and (suspected) exposure to covid-19: Secondary | ICD-10-CM

## 2019-02-17 DIAGNOSIS — Z20828 Contact with and (suspected) exposure to other viral communicable diseases: Secondary | ICD-10-CM | POA: Diagnosis not present

## 2019-02-19 LAB — NOVEL CORONAVIRUS, NAA: SARS-CoV-2, NAA: NOT DETECTED

## 2019-02-20 ENCOUNTER — Telehealth: Payer: Self-pay | Admitting: *Deleted

## 2019-02-20 NOTE — Telephone Encounter (Signed)
Patient called and was given NEGATIVE COVID results .

## 2019-02-25 DIAGNOSIS — N2889 Other specified disorders of kidney and ureter: Secondary | ICD-10-CM | POA: Diagnosis not present

## 2019-02-25 DIAGNOSIS — N281 Cyst of kidney, acquired: Secondary | ICD-10-CM | POA: Diagnosis not present

## 2019-02-25 DIAGNOSIS — K7689 Other specified diseases of liver: Secondary | ICD-10-CM | POA: Diagnosis not present

## 2019-02-25 DIAGNOSIS — N289 Disorder of kidney and ureter, unspecified: Secondary | ICD-10-CM | POA: Diagnosis not present

## 2019-02-25 DIAGNOSIS — K8020 Calculus of gallbladder without cholecystitis without obstruction: Secondary | ICD-10-CM | POA: Diagnosis not present

## 2019-03-18 DIAGNOSIS — Z940 Kidney transplant status: Secondary | ICD-10-CM | POA: Diagnosis not present

## 2019-03-18 DIAGNOSIS — T8619 Other complication of kidney transplant: Secondary | ICD-10-CM | POA: Diagnosis not present

## 2019-03-18 DIAGNOSIS — N186 End stage renal disease: Secondary | ICD-10-CM | POA: Diagnosis not present

## 2019-03-18 DIAGNOSIS — N1831 Chronic kidney disease, stage 3a: Secondary | ICD-10-CM | POA: Diagnosis not present

## 2019-03-18 DIAGNOSIS — Z79899 Other long term (current) drug therapy: Secondary | ICD-10-CM | POA: Diagnosis not present

## 2019-06-17 DIAGNOSIS — Z23 Encounter for immunization: Secondary | ICD-10-CM | POA: Diagnosis not present

## 2019-06-24 DIAGNOSIS — Z79899 Other long term (current) drug therapy: Secondary | ICD-10-CM | POA: Diagnosis not present

## 2019-06-24 DIAGNOSIS — N1831 Chronic kidney disease, stage 3a: Secondary | ICD-10-CM | POA: Diagnosis not present

## 2019-06-24 DIAGNOSIS — I10 Essential (primary) hypertension: Secondary | ICD-10-CM | POA: Diagnosis not present

## 2019-06-24 DIAGNOSIS — M10371 Gout due to renal impairment, right ankle and foot: Secondary | ICD-10-CM | POA: Diagnosis not present

## 2019-06-24 DIAGNOSIS — T8619 Other complication of kidney transplant: Secondary | ICD-10-CM | POA: Diagnosis not present

## 2019-06-24 DIAGNOSIS — N186 End stage renal disease: Secondary | ICD-10-CM | POA: Diagnosis not present

## 2019-06-24 DIAGNOSIS — Z940 Kidney transplant status: Secondary | ICD-10-CM | POA: Diagnosis not present

## 2019-06-24 DIAGNOSIS — Q613 Polycystic kidney, unspecified: Secondary | ICD-10-CM | POA: Diagnosis not present

## 2019-06-24 DIAGNOSIS — D849 Immunodeficiency, unspecified: Secondary | ICD-10-CM | POA: Diagnosis not present

## 2019-07-15 DIAGNOSIS — Z23 Encounter for immunization: Secondary | ICD-10-CM | POA: Diagnosis not present

## 2019-08-15 ENCOUNTER — Encounter (HOSPITAL_BASED_OUTPATIENT_CLINIC_OR_DEPARTMENT_OTHER): Payer: Self-pay | Admitting: Emergency Medicine

## 2019-08-15 ENCOUNTER — Emergency Department (HOSPITAL_BASED_OUTPATIENT_CLINIC_OR_DEPARTMENT_OTHER)
Admission: EM | Admit: 2019-08-15 | Discharge: 2019-08-15 | Disposition: A | Discharge status: Home / Self Care | Payer: BC Managed Care – PPO | Attending: Emergency Medicine | Admitting: Emergency Medicine

## 2019-08-15 ENCOUNTER — Emergency Department (HOSPITAL_BASED_OUTPATIENT_CLINIC_OR_DEPARTMENT_OTHER): Payer: BC Managed Care – PPO

## 2019-08-15 ENCOUNTER — Other Ambulatory Visit: Payer: Self-pay

## 2019-08-15 DIAGNOSIS — Z79899 Other long term (current) drug therapy: Secondary | ICD-10-CM | POA: Insufficient documentation

## 2019-08-15 DIAGNOSIS — Z940 Kidney transplant status: Secondary | ICD-10-CM | POA: Insufficient documentation

## 2019-08-15 DIAGNOSIS — M79602 Pain in left arm: Secondary | ICD-10-CM | POA: Diagnosis not present

## 2019-08-15 DIAGNOSIS — F17290 Nicotine dependence, other tobacco product, uncomplicated: Secondary | ICD-10-CM | POA: Insufficient documentation

## 2019-08-15 DIAGNOSIS — M19012 Primary osteoarthritis, left shoulder: Secondary | ICD-10-CM | POA: Diagnosis not present

## 2019-08-15 DIAGNOSIS — M25512 Pain in left shoulder: Secondary | ICD-10-CM | POA: Insufficient documentation

## 2019-08-15 DIAGNOSIS — M542 Cervicalgia: Secondary | ICD-10-CM | POA: Diagnosis not present

## 2019-08-15 DIAGNOSIS — I10 Essential (primary) hypertension: Secondary | ICD-10-CM | POA: Diagnosis not present

## 2019-08-15 DIAGNOSIS — G8929 Other chronic pain: Secondary | ICD-10-CM | POA: Diagnosis not present

## 2019-08-15 MED ORDER — OXYCODONE HCL 5 MG PO TABS
5.0000 mg | ORAL_TABLET | Freq: Once | ORAL | Status: AC
  Administered 2019-08-15: 5 mg via ORAL
  Filled 2019-08-15: qty 1

## 2019-08-15 MED ORDER — PREDNISONE 20 MG PO TABS: ORAL_TABLET | ORAL | 0 refills | Status: DC

## 2019-08-15 MED ORDER — OXYCODONE HCL 5 MG PO CAPS: 5.0000 mg | ORAL_CAPSULE | Freq: Four times a day (QID) | ORAL | 0 refills | Status: DC | PRN

## 2019-08-15 MED ORDER — PREDNISONE 50 MG PO TABS
60.0000 mg | ORAL_TABLET | Freq: Once | ORAL | Status: AC
  Administered 2019-08-15: 60 mg via ORAL
  Filled 2019-08-15: qty 1

## 2019-08-15 NOTE — Discharge Instructions (Signed)
Please read and follow all provided instructions.  Your diagnoses today include:  1. Acute pain of left shoulder     Tests performed today include:  An x-ray of the affected area - does NOT show any broken bones   Vital signs. See below for your results today.   Medications prescribed:   Prednisone - steroid medicine   It is best to take this medication in the morning to prevent sleeping problems. If you are diabetic, monitor your blood sugar closely and stop taking Prednisone if blood sugar is over 300. Take with food to prevent stomach upset.    Oxycodone - narcotic pain medication  DO NOT drive or perform any activities that require you to be awake and alert because this medicine can make you drowsy.   Take any prescribed medications only as directed.  Home care instructions:   Follow any educational materials contained in this packet  Follow R.I.C.E. Protocol:  R - rest your injury   I  - use ice on injury without applying directly to skin  C - compress injury with bandage or splint  E - elevate the injury as much as possible  Follow-up instructions: Please follow-up with your primary care provider or the provided orthopedic physician (bone specialist) this coming week.  Return instructions:   Please return if your fingers are numb or tingling, appear gray or blue, or you have severe pain (also elevate the arm and loosen splint or wrap if you were given one)  Please return to the Emergency Department if you experience worsening symptoms.   Please return if you have any other emergent concerns.  Additional Information:  Your vital signs today were: BP 127/78 (BP Location: Right Arm)   Pulse 65   Temp 97.8 F (36.6 C) (Oral)   Resp 18   Ht 5\' 10"  (1.778 m)   Wt 68 kg   SpO2 100%   BMI 21.52 kg/m  If your blood pressure (BP) was elevated above 135/85 this visit, please have this repeated by your doctor within one month. --------------

## 2019-08-15 NOTE — ED Triage Notes (Signed)
Pt reports pain to LUE, LT shoulder and LT side neck and clavicle since Monday; no injury; sts he was having difficulty moving it yesterday and had to pick it up with his other hand

## 2019-08-15 NOTE — ED Provider Notes (Signed)
[facility] EMERGENCY DEPARTMENT Provider Note   CSN: [id number] Arrival date & time: 08/15/19  1017     History Chief Complaint  Patient presents with  . Arm Pain    Colton Todd is a 69 y.o. male.  Patient with history of gout, "inflammatory arthritis", chronic kidney disease status post transplant on immunosuppressive therapy --presents to the emergency department today with complaint of progressively worsening left shoulder pain.  Patient states that he developed the pain on Monday (today is Saturday).  Pain is worse with movement and certain positions.  He has noted decreased range of motion over the past several days having to pick up his arm with his other hand at times.  Patient denies fevers, nausea or vomiting.  Pain radiates up into his neck.  No chest pain or shortness of breath.  No numbness or paresthesias into the hand.  Patient has gotten gouty flares in his feet, hands, elbows.  He has never had pain like this in his shoulder.  In the past he has had neck pain which improves with steroids.  Onset of symptoms gradual.  Course is worsening.          Past Medical History:  Diagnosis Date  . Chronic kidney disease   . Hypertension     Patient Active Problem List   Diagnosis Date Noted  . Basal cell carcinoma 04/23/2018  . Hypertension 09/02/2013  . GERD (gastroesophageal reflux disease) 09/02/2013  . History of polycystic kidney disease 09/02/2013  . History of renal transplant 09/02/2013    Past Surgical History:  Procedure Laterality Date  . HERNIA REPAIR    . KIDNEY TRANSPLANT Bilateral        Family History  Problem Relation Age of Onset  . Heart disease Mother   . Cancer Mother   . Heart disease Father   . Kidney disease Father     Social History   Tobacco Use  . Smoking status: Former Research scientist (life sciences)  . Smokeless tobacco: Current User    Types: Chew  Substance Use Topics  . Alcohol use: Yes  . Drug use: Yes    Types: Marijuana   Comment: daily    Home Medications Prior to Admission medications   Medication Sig Start Date End Date Taking? Authorizing Provider  amLODipine (NORVASC) 5 MG tablet Take by mouth. 01/20/19 01/20/20 Yes [provider]  diazepam (VALIUM) 5 MG tablet Take by mouth. 07/28/19  Yes [provider]  metoprolol tartrate (LOPRESSOR) 25 MG tablet TAKE 1 TABLET BY MOUTH TWICE A DAY 12/30/18  Yes [provider]  Sirolimus (RAPAMUNE) 0.5 MG tablet Take by mouth. 06/30/19 06/29/20 Yes [provider]  allopurinol (ZYLOPRIM) 300 MG tablet Take 300 mg by mouth daily. 06/24/19   [provider]  fluconazole (DIFLUCAN) 200 MG tablet Take 200 mg by mouth 2 (two) times daily. 07/27/19   [provider]  fluticasone (FLONASE) 50 MCG/ACT nasal spray SPRAY 2 SPRAYS INTO EACH NOSTRIL EVERY DAY Patient not taking: Reported on 11/26/2018 05/20/18   [name] Fraise, MD  hydroxychloroquine (PLAQUENIL) 200 MG tablet Take 200 mg by mouth 2 (two) times daily. 08/10/19   [provider]  K Phos Mono-Sod Phos Di & Mono (PHOSPHA 250 NEUTRAL) 155-852-130 MG TABS Take 1 tablet by mouth 2 (two) times daily. 09/17/17   [provider]  losartan (COZAAR) 50 MG tablet Take 100 mg by mouth daily. 06/24/19   [provider]  mycophenolate (MYFORTIC) 360 MG TBEC EC  tablet TAKE 1 TABLET BY MOUTH TWICE A DAY 04/24/16   [provider]  potassium chloride (K-DUR) 10 MEQ tablet TAKE TWO TABLETS (20 MEQ DOSE) BY MOUTH DAILY FOR 5 DAYS. 12/27/17   [provider]  pregabalin (LYRICA) 50 MG capsule Take 50 mg by mouth 3 (three) times daily.    [provider]  sodium bicarbonate 650 MG tablet Take 650 mg by mouth 4 (four) times daily.    [provider]    Allergies    Codeine and Hydrocodone  Review of Systems   Review of Systems  Constitutional: Negative for activity change, chills and fever.  Respiratory: Negative for shortness  of breath.   Cardiovascular: Negative for chest pain.  Gastrointestinal: Negative for nausea and vomiting.  Musculoskeletal: Positive for arthralgias and joint swelling. Negative for back pain, gait problem and neck pain.  Skin: Negative for wound.  Neurological: Negative for weakness and numbness.    Physical Exam Updated Vital Signs BP 127/78 (BP Location: Right Arm)   Pulse 65   Temp 97.8 F (36.6 C) (Oral)   Resp 18   Ht 5\' 10"  (1.778 m)   Wt 68 kg   SpO2 100%   BMI 21.52 kg/m   Physical Exam Vitals and nursing note reviewed.  Constitutional:      Appearance: He is well-developed.  HENT:     Head: Normocephalic and atraumatic.  Eyes:     Conjunctiva/sclera: Conjunctivae normal.  Cardiovascular:     Pulses: Normal pulses. No decreased pulses.  Musculoskeletal:        General: Tenderness present.     Left shoulder: Tenderness present. No swelling or effusion. Decreased range of motion.     Left upper arm: No tenderness or bony tenderness.     Left elbow: Normal range of motion. No tenderness.     Left forearm: No swelling or tenderness.     Left wrist: Normal range of motion.     Cervical back: Normal range of motion and neck supple. Bony tenderness present. No pain with movement.     Thoracic back: No tenderness or bony tenderness. Normal range of motion.     Comments: Mild erythema over the anterior shoulder.  Patient with decreased range of motion with abduction.  He is able to put his arm behind his back but does so slowly.  Skin:    General: Skin is warm and dry.  Neurological:     Mental Status: He is alert.     Sensory: No sensory deficit.     Comments: Motor, sensation, and vascular distal to the injury is fully intact.      ED Results / Procedures / Treatments   Labs (all labs ordered are listed, but only abnormal results are displayed) Labs Reviewed - No data to display  EKG None  Radiology DG Cervical Spine Complete  Result Date:  08/15/2019 CLINICAL DATA:  Pt reports pain to LUE, LT shoulder and LT side neck and clavicle since Monday; no injury; sts he was having difficulty moving it yesterday and had to pick it up with his other hand EXAM: CERVICAL SPINE - COMPLETE 4+ VIEW COMPARISON:  09/16/2014 FINDINGS: No fracture or bone lesion. Grade 1 anterolisthesis of C4 on C5. Slight anterolisthesis of C3 and C4. Straightened cervical lordosis. Moderate to marked loss of disc height at C5-C6. Remaining cervical discs are well preserved in height. No significant neural foraminal narrowing. Soft tissues are unremarkable. Skeletal structures are demineralized. IMPRESSION: 1. No  fracture or acute finding. 2. Degenerative changes as detailed, greatest at C5-C6 which has mildly progressed when compared to the prior radiographs. Electronically Signed   By: Lajean Manes M.D.   On: 08/15/2019 11:21   DG Shoulder Left  Result Date: 08/15/2019 CLINICAL DATA:  Left shoulder and neck pain for 5 days EXAM: LEFT SHOULDER - 2+ VIEW COMPARISON:  None. FINDINGS: Normal alignment without acute osseous finding or fracture. No subluxation or dislocation. Minor AC joint degenerative change without separation. Included left chest unremarkable. Small subcentimeter calcified granuloma in the left mid lung. IMPRESSION: No acute finding by plain radiography Mild AC joint degenerative change. Electronically Signed   By: Jerilynn Mages.  Shick M.D.   On: 08/15/2019 11:20    Procedures Procedures (including critical care time)  Medications Ordered in ED Medications  oxyCODONE (Oxy IR/ROXICODONE) immediate release tablet 5 mg (5 mg Oral Given 08/15/19 1106)  predniSONE (DELTASONE) tablet 60 mg (60 mg Oral Given 08/15/19 1239)    ED Course  I have reviewed the triage vital signs and the nursing notes.  Pertinent labs & imaging results that were available during my care of the patient were reviewed by me and considered in my medical decision making (see chart for  details).  Patient seen and examined.  Imaging ordered.  Meds ordered.  Vital signs reviewed and are as follows: BP 127/78 (BP Location: Right Arm)   Pulse 65   Temp 97.8 F (36.6 C) (Oral)   Resp 18   Ht 5\' 10"  (1.778 m)   Wt 68 kg   SpO2 100%   BMI 21.52 kg/m   Patient discussed with and seen by Dr. Stark Jock.  Imaging reassuring.  Will treat for a gouty arthritis flare.  Discussed signs and symptoms to return with patient at bedside including fever, worsening pain, expanding area of redness around the shoulder, inability to move the arm.  Strongly encouraged PCP/orthopedics follow-up.  Sports medicine referral given.  Encouraged that the patient call for follow-up appointment early in the week (today is Saturday).   Patient counseled on use of narcotic pain medications. Counseled not to combine these medications with others containing tylenol. Urged not to drink alcohol, drive, or perform any other activities that requires focus while taking these medications. The patient verbalizes understanding and agrees with the plan.     MDM Rules/Calculators/A&P                      Patient with history of gout presents with left shoulder pain over the past several days.  Patient has reasonably preserved range of motion however has difficulty with abduction.  He does have a history of kidney transplant and is on immunocompromising medications.  He does not have a fever and is not tachycardic.  He appears nontoxic.  Remainder of left upper extremity is neurovascularly intact.  At this point, very low suspicion for septic arthritis.  He does not have a large effusion or vital sign abnormalities.  Risk of arthrocentesis seems higher than benefit at this point.  This may represent a flare of gouty or inflammatory arthritis.  Will give course of steroids and pain medication in the interim until he can follow-up with either his primary care doctor or an orthopedic doctor.  Return instructions discussed as  above the patient seems reliable return with any worsening.    Final Clinical Impression(s) / ED Diagnoses Final diagnoses:  Acute pain of left shoulder    Rx / DC Orders ED  Discharge Orders         Ordered    predniSONE (DELTASONE) 20 MG tablet     08/15/19 1235    oxycodone (OXY-IR) 5 MG capsule  Every 6 hours PRN     08/15/19 1235           Carlisle Cater, PA-C 08/15/19 1253    Veryl Speak, MD 08/16/19 [phone]

## 2019-08-31 ENCOUNTER — Emergency Department (HOSPITAL_BASED_OUTPATIENT_CLINIC_OR_DEPARTMENT_OTHER): Payer: BC Managed Care – PPO

## 2019-08-31 ENCOUNTER — Emergency Department (HOSPITAL_BASED_OUTPATIENT_CLINIC_OR_DEPARTMENT_OTHER)
Admission: EM | Admit: 2019-08-31 | Discharge: 2019-08-31 | Disposition: A | Discharge status: Home / Self Care | Payer: BC Managed Care – PPO | Attending: Emergency Medicine | Admitting: Emergency Medicine

## 2019-08-31 ENCOUNTER — Encounter (HOSPITAL_BASED_OUTPATIENT_CLINIC_OR_DEPARTMENT_OTHER): Payer: Self-pay | Admitting: Emergency Medicine

## 2019-08-31 DIAGNOSIS — M25532 Pain in left wrist: Secondary | ICD-10-CM | POA: Diagnosis not present

## 2019-08-31 DIAGNOSIS — Z885 Allergy status to narcotic agent status: Secondary | ICD-10-CM | POA: Insufficient documentation

## 2019-08-31 DIAGNOSIS — F17220 Nicotine dependence, chewing tobacco, uncomplicated: Secondary | ICD-10-CM | POA: Diagnosis not present

## 2019-08-31 DIAGNOSIS — W19XXXA Unspecified fall, initial encounter: Secondary | ICD-10-CM

## 2019-08-31 DIAGNOSIS — R2232 Localized swelling, mass and lump, left upper limb: Secondary | ICD-10-CM

## 2019-08-31 DIAGNOSIS — Z7902 Long term (current) use of antithrombotics/antiplatelets: Secondary | ICD-10-CM | POA: Insufficient documentation

## 2019-08-31 DIAGNOSIS — Z940 Kidney transplant status: Secondary | ICD-10-CM | POA: Insufficient documentation

## 2019-08-31 DIAGNOSIS — Z79899 Other long term (current) drug therapy: Secondary | ICD-10-CM | POA: Insufficient documentation

## 2019-08-31 DIAGNOSIS — M25522 Pain in left elbow: Secondary | ICD-10-CM | POA: Diagnosis not present

## 2019-08-31 DIAGNOSIS — I10 Essential (primary) hypertension: Secondary | ICD-10-CM | POA: Diagnosis not present

## 2019-08-31 DIAGNOSIS — M7989 Other specified soft tissue disorders: Secondary | ICD-10-CM | POA: Diagnosis not present

## 2019-08-31 DIAGNOSIS — S59902A Unspecified injury of left elbow, initial encounter: Secondary | ICD-10-CM | POA: Diagnosis not present

## 2019-08-31 MED ORDER — OXYCODONE HCL 5 MG PO CAPS: 5.0000 mg | ORAL_CAPSULE | Freq: Four times a day (QID) | ORAL | 0 refills | Status: AC | PRN

## 2019-08-31 MED ORDER — PREDNISONE 20 MG PO TABS
ORAL_TABLET | ORAL | 0 refills | Status: DC
Start: 2019-08-31 — End: 2020-09-09

## 2019-08-31 MED ORDER — OXYCODONE-ACETAMINOPHEN 5-325 MG PO TABS
1.0000 | ORAL_TABLET | Freq: Once | ORAL | Status: AC
  Administered 2019-08-31: 1 via ORAL
  Filled 2019-08-31: qty 1

## 2019-08-31 NOTE — Discharge Instructions (Addendum)
Take prednisone as prescribed.  Take the entire course. Use Tylenol as needed for further pain control. You may also use your remaining pain pills you have at home for pain control. Use the sling as needed for comfort. Follow-up with your primary care doctor in several days if your symptoms not improving. Return to the emergency room if you develop fevers, severe worsening pain, if your arm becomes red and hot, if you develop numbness of your hand, or with any new, worsening, or concerning symptoms.

## 2019-08-31 NOTE — ED Provider Notes (Signed)
[facility] EMERGENCY DEPARTMENT Provider Note   CSN: [id number] Arrival date & time: 08/31/19  1033     History Chief Complaint  Patient presents with   Elbow Pain    Colton Todd is a 69 y.o. male presenting for evaluation of left forearm pain.  Patient states 2 days ago he stubbed his toe and fell, is not quite sure how he landed.  Later that evening, he developed pain in his left elbow.  Since then, he has developed welling of his left forearm, and today he developed severe pain in his left wrist.  He has a history of gout, and states this pain feels similar.  He denies injury to his wrist.  He took 3 of his home cocci over the past 24 hours without improvement of pain.  He has not tried anything else.  He denies symptoms on the right side.  He is not on blood thinners.  He has a history of kidney transplant is on immunosuppression.  He denies fevers, chills, numbness, tingling.  Additional history obtained from chart review.  Patient with a history of kidney transplant on immunosuppression, hypertension, GERD.  HPI     Past Medical History:  Diagnosis Date   Chronic kidney disease    Hypertension     Patient Active Problem List   Diagnosis Date Noted   Basal cell carcinoma 04/23/2018   Hypertension 09/02/2013   GERD (gastroesophageal reflux disease) 09/02/2013   History of polycystic kidney disease 09/02/2013   History of renal transplant 09/02/2013    Past Surgical History:  Procedure Laterality Date   HERNIA REPAIR     KIDNEY TRANSPLANT Bilateral        Family History  Problem Relation Age of Onset   Heart disease Mother    Cancer Mother    Heart disease Father    Kidney disease Father     Social History   Tobacco Use   Smoking status: Former Smoker   Smokeless tobacco: Current User    Types: Chew  Substance Use Topics   Alcohol use: Yes   Drug use: Yes    Types: Marijuana    Comment: daily    Home  Medications Prior to Admission medications   Medication Sig Start Date End Date Taking? Authorizing Provider  allopurinol (ZYLOPRIM) 300 MG tablet Take 300 mg by mouth daily. 06/24/19   [provider]  amLODipine (NORVASC) 5 MG tablet Take by mouth. 01/20/19 01/20/20  [provider]  diazepam (VALIUM) 5 MG tablet Take by mouth. 07/28/19   [provider]  fluconazole (DIFLUCAN) 200 MG tablet Take 200 mg by mouth 2 (two) times daily. 07/27/19   [provider]  fluticasone (FLONASE) 50 MCG/ACT nasal spray SPRAY 2 SPRAYS INTO EACH NOSTRIL EVERY DAY Patient not taking: Reported on 11/26/2018 05/20/18   [name] Fraise, MD  hydroxychloroquine (PLAQUENIL) 200 MG tablet Take 200 mg by mouth 2 (two) times daily. 08/10/19   [provider]  K Phos Mono-Sod Phos Di & Mono (PHOSPHA 250 NEUTRAL) 155-852-130 MG TABS Take 1 tablet by mouth 2 (two) times daily. 09/17/17   [provider]  losartan (COZAAR) 50 MG tablet Take 100 mg by mouth daily. 06/24/19   [provider]  metoprolol tartrate (LOPRESSOR) 25 MG tablet TAKE 1 TABLET BY MOUTH TWICE A DAY 12/30/18   [provider]  mycophenolate (MYFORTIC) 360 MG TBEC EC tablet TAKE 1 TABLET BY MOUTH TWICE A DAY 04/24/16   [provider]  oxycodone (OXY-IR) 5 MG capsule Take 1 capsule (5 mg total) by mouth every 6 (six) hours as needed. 08/31/19   Linville Decarolis, PA-C  potassium chloride (K-DUR) 10 MEQ tablet TAKE TWO TABLETS (20 MEQ DOSE) BY MOUTH DAILY FOR 5 DAYS. 12/27/17   [provider]  predniSONE (DELTASONE) 20 MG tablet 3 Tabs PO Days 1-3, then 2 tabs PO Days 4-6, then 1 tab PO Day 7-9, then Half Tab PO Day 10-12 08/31/19   Unity Luepke, PA-C  pregabalin (LYRICA) 50 MG capsule Take 50 mg by mouth 3 (three) times daily.    [provider]  Sirolimus (RAPAMUNE) 0.5 MG tablet Take by mouth. 06/30/19 06/29/20  [provider]  sodium bicarbonate 650 MG  tablet Take 650 mg by mouth 4 (four) times daily.    [provider]    Allergies    Codeine and Hydrocodone  Review of Systems   Review of Systems  Musculoskeletal: Positive for arthralgias, joint swelling and myalgias.  Allergic/Immunologic: Positive for immunocompromised state.  All other systems reviewed and are negative.   Physical Exam Updated Vital Signs BP (!) 144/80 (BP Location: Right Arm)    Pulse 81    Temp 98.3 F (36.8 C) (Oral)    Ht 5\' 10"  (1.778 m)    Wt 68 kg    SpO2 99%    BMI 21.52 kg/m   Physical Exam Vitals and nursing note reviewed.  Constitutional:      General: He is not in acute distress.    Appearance: He is well-developed.  HENT:     Head: Normocephalic and atraumatic.  Pulmonary:     Effort: Pulmonary effort is normal.  Abdominal:     General: There is no distension.  Musculoskeletal:        General: Swelling and tenderness present. Normal range of motion.     Cervical back: Normal range of motion.     Comments: Swelling of the left forearm from elbow to wrist.  No erythema or induration.  Forearm is warm.  Old appearing scabs of the dorsal left hand and dorsal middle finger without signs of infection or purulent drainage.  Radial pulses 2+ bilaterally.  Tenderness palpation of the entire left wrist and elbow.  Skin:    General: Skin is warm.     Capillary Refill: Capillary refill takes less than 2 seconds.     Findings: No rash.  Neurological:     Mental Status: He is alert and oriented to person, place, and time.     ED Results / Procedures / Treatments   Labs (all labs ordered are listed, but only abnormal results are displayed) Labs Reviewed - No data to display  EKG None  Radiology DG Elbow Complete Left  Result Date: 08/31/2019 CLINICAL DATA:  Fall with left elbow pain. History of gout. Initial encounter. EXAM: LEFT ELBOW - COMPLETE 3+ VIEW COMPARISON:  None. FINDINGS: No acute fracture or dislocation identified. No  evidence of joint effusion. Suggestion of some surrounding soft tissue swelling. Mild degenerative disease of the elbow without erosion or bony destruction. No bony lesions. IMPRESSION: No acute fracture or dislocation. Suggestion of surrounding soft tissue swelling. Electronically Signed   By: Aletta Edouard M.D.   On: 08/31/2019 11:37   DG Wrist Complete Left  Result Date: 08/31/2019 CLINICAL DATA:  Swelling, pain and erythema of the left wrist. History of gout. EXAM: LEFT WRIST - COMPLETE 3+ VIEW COMPARISON:  None. FINDINGS: No acute fracture  or dislocation. Soft tissue and ligamentous calcification present as well as some surrounding soft tissue swelling. No bony erosions. No bony lesions identified. IMPRESSION: No acute fracture. Soft tissue and ligamentous calcification with surrounding soft tissue swelling. Electronically Signed   By: Aletta Edouard M.D.   On: 08/31/2019 11:38   US Venous Img Upper Left (DVT Study)  Result Date: 08/31/2019 CLINICAL DATA:  Left arm swelling.  History of gout. EXAM: LEFT UPPER EXTREMITY VENOUS DOPPLER ULTRASOUND TECHNIQUE: Gray-scale sonography with graded compression, as well as color Doppler and duplex ultrasound were performed to evaluate the upper extremity deep venous system from the level of the subclavian vein and including the jugular, axillary, basilic, radial, ulnar and upper cephalic vein. Spectral Doppler was utilized to evaluate flow at rest and with distal augmentation maneuvers. COMPARISON:  None. FINDINGS: Contralateral Subclavian Vein: Respiratory phasicity is normal and symmetric with the symptomatic side. No evidence of thrombus. Normal compressibility. Internal Jugular Vein: No evidence of thrombus. Normal compressibility, respiratory phasicity and response to augmentation. Subclavian Vein: No evidence of thrombus. Normal compressibility, respiratory phasicity and response to augmentation. Axillary Vein: No evidence of thrombus. Normal  compressibility, respiratory phasicity and response to augmentation. Cephalic Vein: No evidence of thrombus. Normal compressibility, respiratory phasicity and response to augmentation. Basilic Vein: No evidence of thrombus. Normal compressibility, respiratory phasicity and response to augmentation. Brachial Veins: No evidence of thrombus. Normal compressibility, respiratory phasicity and response to augmentation. Radial Veins: No evidence of thrombus. Normal compressibility, respiratory phasicity and response to augmentation. Ulnar Veins: No evidence of thrombus. Normal compressibility, respiratory phasicity and response to augmentation. Venous Reflux:  None visualized. Other Findings:  None visualized. IMPRESSION: No evidence of DVT within the left upper extremity. Electronically Signed   By: Dorise Bullion III M.D   On: 08/31/2019 12:57    Procedures Procedures (including critical care time)  Medications Ordered in ED Medications  oxyCODONE-acetaminophen (PERCOCET/ROXICET) 5-325 MG per tablet 1 tablet (1 tablet Oral Given 08/31/19 1202)    ED Course  I have reviewed the triage vital signs and the nursing notes.  Pertinent labs & imaging results that were available during my care of the patient were reviewed by me and considered in my medical decision making (see chart for details).    MDM Rules/Calculators/A&P                      Presenting for evaluation of left elbow and wrist pain and swelling.  On exam, patient appears neurovascularly intact.  His forearm is swollen and warm, but not erythematous.  No obvious signs of infection.  Patient without fevers or chills, less likely septic joint.  Consider gout.  Consider DVT status post trauma.  Consider swelling due to trauma.  X-rays obtained from triage read interpreted by me, no fractures or dislocations, although does show soft tissue swelling.  Case discussed with attending, Dr. tegeler evaluated the patient.  Will obtain upper extremity  DVT ultrasound and reassess.  Upper extremity ultrasound negative for DVT.  Discussed with patient.  Discussed option for treatment for gout with prednisone, patient is agreeable. Will give sling for comfort.  Encourage close monitoring of symptoms, prompt return if he develops erythema, warmth, numbness, or any new worsening symptoms.  Encourage close follow-up with his primary care doctor.  At this time, patient appears safe for discharge.  Return precautions given.  Patient states he understands and agrees to plan.  Final Clinical Impression(s) / ED Diagnoses Final diagnoses:  Left  wrist pain  Left elbow pain  Localized swelling of left forearm  Fall, initial encounter    Rx / DC Orders ED Discharge Orders         Ordered    predniSONE (DELTASONE) 20 MG tablet     08/31/19 1346    oxycodone (OXY-IR) 5 MG capsule  Every 6 hours PRN     08/31/19 1415           Emony Dormer, PA-C 08/31/19 1815    Tegeler, Gwenyth Allegra, MD 09/01/19 1145

## 2019-08-31 NOTE — ED Triage Notes (Signed)
Was walking dog Friday night and hit toe and tripped over a swing and fell and hit left elbow

## 2019-09-24 DIAGNOSIS — N186 End stage renal disease: Secondary | ICD-10-CM | POA: Diagnosis not present

## 2019-09-24 DIAGNOSIS — T8619 Other complication of kidney transplant: Secondary | ICD-10-CM | POA: Diagnosis not present

## 2019-09-24 DIAGNOSIS — M109 Gout, unspecified: Secondary | ICD-10-CM | POA: Diagnosis not present

## 2019-09-24 DIAGNOSIS — N1831 Chronic kidney disease, stage 3a: Secondary | ICD-10-CM | POA: Diagnosis not present

## 2019-09-24 DIAGNOSIS — Z79899 Other long term (current) drug therapy: Secondary | ICD-10-CM | POA: Diagnosis not present

## 2019-10-05 DIAGNOSIS — Z940 Kidney transplant status: Secondary | ICD-10-CM | POA: Diagnosis not present

## 2019-10-05 DIAGNOSIS — N1831 Chronic kidney disease, stage 3a: Secondary | ICD-10-CM | POA: Diagnosis not present

## 2019-10-05 DIAGNOSIS — Z79899 Other long term (current) drug therapy: Secondary | ICD-10-CM | POA: Diagnosis not present

## 2019-10-05 DIAGNOSIS — T8619 Other complication of kidney transplant: Secondary | ICD-10-CM | POA: Diagnosis not present

## 2019-10-07 ENCOUNTER — Ambulatory Visit (INDEPENDENT_AMBULATORY_CARE_PROVIDER_SITE_OTHER): Payer: BC Managed Care – PPO | Admitting: Family Medicine

## 2019-10-07 ENCOUNTER — Encounter: Payer: Self-pay | Admitting: Family Medicine

## 2019-10-07 ENCOUNTER — Other Ambulatory Visit: Payer: Self-pay

## 2019-10-07 VITALS — BP 111/64 | HR 86 | Temp 97.1°F | Resp 20 | Ht 70.0 in | Wt 140.4 lb

## 2019-10-07 DIAGNOSIS — Z940 Kidney transplant status: Secondary | ICD-10-CM

## 2019-10-07 DIAGNOSIS — I10 Essential (primary) hypertension: Secondary | ICD-10-CM | POA: Diagnosis not present

## 2019-10-07 DIAGNOSIS — K219 Gastro-esophageal reflux disease without esophagitis: Secondary | ICD-10-CM | POA: Diagnosis not present

## 2019-10-07 DIAGNOSIS — Z23 Encounter for immunization: Secondary | ICD-10-CM | POA: Diagnosis not present

## 2019-10-07 DIAGNOSIS — D649 Anemia, unspecified: Secondary | ICD-10-CM

## 2019-10-07 MED ORDER — PREGABALIN 100 MG PO CAPS: 100.0000 mg | ORAL_CAPSULE | Freq: Two times a day (BID) | ORAL | 1 refills | Status: AC

## 2019-10-07 NOTE — Progress Notes (Signed)
Subjective:  Patient ID: Colton Todd, male    DOB: 04-04-1950  Age: 69 y.o. MRN: [id number]  CC: No chief complaint on file.   HPI Colton Todd presents for  follow-up of hypertension. Patient has no history of headache chest pain or shortness of breath or recent cough. Patient also denies symptoms of TIA such as focal numbness or weakness. Patient denies side effects from medication. States taking it regularly.  Having a lot of bone pain.  Has a renal transplant. Followed by Dr. Algernon Huxley of [facility] in [facility].   History Colton Todd has a past medical history of Chronic kidney disease and Hypertension.   He has a past surgical history that includes Kidney transplant (Bilateral) and Hernia repair.   His family history includes Cancer in his mother; Heart disease in his father and mother; Kidney disease in his father.He reports that he has quit smoking. His smokeless tobacco use includes chew. He reports current alcohol use. He reports current drug use. Drug: Marijuana.  Current Outpatient Medications on File Prior to Visit  Medication Sig Dispense Refill  . allopurinol (ZYLOPRIM) 300 MG tablet Take 300 mg by mouth daily.    Marland Kitchen amLODipine (NORVASC) 5 MG tablet Take by mouth.    . diazepam (VALIUM) 5 MG tablet Take by mouth.    . fluconazole (DIFLUCAN) 200 MG tablet Take 200 mg by mouth daily.     . fluticasone (FLONASE) 50 MCG/ACT nasal spray SPRAY 2 SPRAYS INTO EACH NOSTRIL EVERY DAY 48 g 1  . K Phos Mono-Sod Phos Di & Mono (PHOSPHA 250 NEUTRAL) 155-852-130 MG TABS Take 1 tablet by mouth 2 (two) times daily.  11  . losartan (COZAAR) 50 MG tablet Take 100 mg by mouth daily.    . metoprolol tartrate (LOPRESSOR) 25 MG tablet TAKE 1 TABLET BY MOUTH TWICE A DAY    . mycophenolate (MYFORTIC) 360 MG TBEC EC tablet TAKE 1 TABLET BY MOUTH TWICE A DAY    . oxycodone (OXY-IR) 5 MG capsule Take 1 capsule (5 mg total) by mouth every 6 (six) hours as needed. 4 capsule 0  .  potassium chloride (K-DUR) 10 MEQ tablet TAKE TWO TABLETS (20 MEQ DOSE) BY MOUTH DAILY FOR 5 DAYS.  0  . predniSONE (DELTASONE) 20 MG tablet 3 Tabs PO Days 1-3, then 2 tabs PO Days 4-6, then 1 tab PO Day 7-9, then Half Tab PO Day 10-12 20 tablet 0  . Sirolimus (RAPAMUNE) 0.5 MG tablet Take by mouth.    . sodium bicarbonate 650 MG tablet Take 650 mg by mouth 4 (four) times daily.     No current facility-administered medications on file prior to visit.    ROS Review of Systems  Constitutional: Negative.   HENT: Negative.   Eyes: Negative for visual disturbance.  Respiratory: Negative for cough and shortness of breath.   Cardiovascular: Negative for chest pain and leg swelling.  Gastrointestinal: Negative for abdominal pain, diarrhea, nausea and vomiting.  Genitourinary: Negative for difficulty urinating.  Musculoskeletal: Negative for arthralgias and myalgias.  Skin: Negative for rash.  Neurological: Negative for headaches.  Psychiatric/Behavioral: Negative for sleep disturbance.    Objective:  BP 111/64   Pulse 86   Temp (!) 97.1 F (36.2 C) (Temporal)   Resp 20   Ht '5\' 10"'  (1.778 m)   Wt 140 lb 6 oz (63.7 kg)   SpO2 98%   BMI 20.14 kg/m   BP Readings from Last 3 Encounters:  10/07/19  111/64  08/31/19 (!) 144/80  08/15/19 127/78    Wt Readings from Last 3 Encounters:  10/07/19 140 lb 6 oz (63.7 kg)  08/31/19 150 lb (68 kg)  08/15/19 150 lb (68 kg)     Physical Exam Constitutional:      General: He is not in acute distress.    Appearance: He is well-developed.  HENT:     Head: Normocephalic and atraumatic.     Right Ear: External ear normal.     Left Ear: External ear normal.     Nose: Nose normal.  Eyes:     Conjunctiva/sclera: Conjunctivae normal.     Pupils: Pupils are equal, round, and reactive to light.  Cardiovascular:     Rate and Rhythm: Normal rate and regular rhythm.     Heart sounds: Normal heart sounds. No murmur heard.   Pulmonary:      Effort: Pulmonary effort is normal. No respiratory distress.     Breath sounds: Normal breath sounds. No wheezing or rales.  Abdominal:     Palpations: Abdomen is soft.     Tenderness: There is no abdominal tenderness.  Musculoskeletal:        General: Normal range of motion.     Cervical back: Normal range of motion and neck supple.  Skin:    General: Skin is warm and dry.  Neurological:     Mental Status: He is alert and oriented to person, place, and time.     Deep Tendon Reflexes: Reflexes are normal and symmetric.  Psychiatric:        Behavior: Behavior normal.        Thought Content: Thought content normal.        Judgment: Judgment normal.       Assessment & Plan:   Diagnoses and all orders for this visit:  Essential hypertension -     CBC with Differential/Platelet -     CMP14+EGFR  Gastroesophageal reflux disease without esophagitis -     CBC with Differential/Platelet -     CMP14+EGFR  History of renal transplant -     CBC with Differential/Platelet -     CMP14+EGFR -     Magnesium -     Phosphorus  Other orders -     Tdap vaccine greater than or equal to 7yo IM -     pregabalin (LYRICA) 100 MG capsule; Take 1 capsule (100 mg total) by mouth 2 (two) times daily. -     Fe+TIBC+Fer+B12+Folic -     Specimen status report   Allergies as of 10/07/2019      Reactions   Codeine Itching   Hydrocodone Itching      Medication List       Accurate as of October 07, 2019 11:59 PM. If you have any questions, ask your nurse or doctor.        STOP taking these medications   hydroxychloroquine 200 MG tablet Commonly known as: PLAQUENIL Stopped by: [name] Fraise, MD     TAKE these medications   allopurinol 300 MG tablet Commonly known as: ZYLOPRIM Take 300 mg by mouth daily.   amLODipine 5 MG tablet Commonly known as: NORVASC Take by mouth.   diazepam 5 MG tablet Commonly known as: VALIUM Take by mouth.   fluconazole 200 MG tablet Commonly known as:  DIFLUCAN Take 200 mg by mouth daily.   fluticasone 50 MCG/ACT nasal spray Commonly known as: FLONASE SPRAY 2 SPRAYS INTO EACH NOSTRIL EVERY DAY  losartan 50 MG tablet Commonly known as: COZAAR Take 100 mg by mouth daily.   metoprolol tartrate 25 MG tablet Commonly known as: LOPRESSOR TAKE 1 TABLET BY MOUTH TWICE A DAY   mycophenolate 360 MG Tbec EC tablet Commonly known as: MYFORTIC TAKE 1 TABLET BY MOUTH TWICE A DAY   oxycodone 5 MG capsule Commonly known as: OXY-IR Take 1 capsule (5 mg total) by mouth every 6 (six) hours as needed.   Phospha 250 Neutral 155-852-130 MG Tabs Take 1 tablet by mouth 2 (two) times daily.   potassium chloride 10 MEQ tablet Commonly known as: KLOR-CON TAKE TWO TABLETS (20 MEQ DOSE) BY MOUTH DAILY FOR 5 DAYS.   predniSONE 20 MG tablet Commonly known as: DELTASONE 3 Tabs PO Days 1-3, then 2 tabs PO Days 4-6, then 1 tab PO Day 7-9, then Half Tab PO Day 10-12   pregabalin 100 MG capsule Commonly known as: LYRICA Take 1 capsule (100 mg total) by mouth 2 (two) times daily. What changed:   medication strength  how much to take  when to take this Changed by: [name] Fraise, MD   Sirolimus 0.5 MG tablet Commonly known as: RAPAMUNE Take by mouth.   sodium bicarbonate 650 MG tablet Take 650 mg by mouth 4 (four) times daily.       Meds ordered this encounter  Medications  . pregabalin (LYRICA) 100 MG capsule    Sig: Take 1 capsule (100 mg total) by mouth 2 (two) times daily.    Dispense:  180 capsule    Refill:  1    Pt. Sees ortho Dr. Jerilynn Mages". Phone number [phone]. Has appt. For 7/26 at 2:45 Follow-up: No follow-ups on file.  [name] Fraise, M.D.

## 2019-10-08 ENCOUNTER — Other Ambulatory Visit: Payer: Self-pay | Admitting: *Deleted

## 2019-10-08 DIAGNOSIS — D649 Anemia, unspecified: Secondary | ICD-10-CM

## 2019-10-08 LAB — CMP14+EGFR
ALT: 10 IU/L (ref 0–44)
AST: 22 IU/L (ref 0–40)
Albumin/Globulin Ratio: 1.6 (ref 1.2–2.2)
Albumin: 3.6 g/dL — ABNORMAL LOW (ref 3.8–4.8)
Alkaline Phosphatase: 123 IU/L — ABNORMAL HIGH (ref 48–121)
BUN/Creatinine Ratio: 13 (ref 10–24)
BUN: 22 mg/dL (ref 8–27)
Bilirubin Total: 0.2 mg/dL (ref 0.0–1.2)
CO2: 22 mmol/L (ref 20–29)
Calcium: 9.3 mg/dL (ref 8.6–10.2)
Chloride: 105 mmol/L (ref 96–106)
Creatinine, Ser: 1.67 mg/dL — ABNORMAL HIGH (ref 0.76–1.27)
GFR calc Af Amer: 48 mL/min/{1.73_m2} — ABNORMAL LOW (ref >59)
GFR calc non Af Amer: 42 mL/min/{1.73_m2} — ABNORMAL LOW (ref >59)
Globulin, Total: 2.3 g/dL (ref 1.5–4.5)
Glucose: 84 mg/dL (ref 65–99)
Potassium: 4 mmol/L (ref 3.5–5.2)
Sodium: 140 mmol/L (ref 134–144)
Total Protein: 5.9 g/dL — ABNORMAL LOW (ref 6.0–8.5)

## 2019-10-08 LAB — CBC WITH DIFFERENTIAL/PLATELET
Basophils Absolute: 0.1 10*3/uL (ref 0.0–0.2)
Basos: 1 %
EOS (ABSOLUTE): 0.2 10*3/uL (ref 0.0–0.4)
Eos: 2 %
Hematocrit: 34.8 % — ABNORMAL LOW (ref 37.5–51.0)
Hemoglobin: 10.7 g/dL — ABNORMAL LOW (ref 13.0–17.7)
Immature Grans (Abs): 0 10*3/uL (ref 0.0–0.1)
Immature Granulocytes: 0 %
Lymphocytes Absolute: 1.2 10*3/uL (ref 0.7–3.1)
Lymphs: 18 %
MCH: 26.2 pg — ABNORMAL LOW (ref 26.6–33.0)
MCHC: 30.7 g/dL — ABNORMAL LOW (ref 31.5–35.7)
MCV: 85 fL (ref 79–97)
Monocytes Absolute: 0.9 10*3/uL (ref 0.1–0.9)
Monocytes: 13 %
Neutrophils Absolute: 4.6 10*3/uL (ref 1.4–7.0)
Neutrophils: 66 %
Platelets: 344 10*3/uL (ref 150–450)
RBC: 4.08 x10E6/uL — ABNORMAL LOW (ref 4.14–5.80)
RDW: 15.9 % — ABNORMAL HIGH (ref 11.6–15.4)
WBC: 7 10*3/uL (ref 3.4–10.8)

## 2019-10-08 LAB — MAGNESIUM: Magnesium: 1.9 mg/dL (ref 1.6–2.3)

## 2019-10-08 LAB — PHOSPHORUS: Phosphorus: 2.9 mg/dL (ref 2.8–4.1)

## 2019-10-10 LAB — SPECIMEN STATUS REPORT

## 2019-10-10 LAB — FE+TIBC+FER+B12+FOLIC
Ferritin: 262 ng/mL (ref 30–400)
Folate: 17.1 ng/mL (ref >3.0)
Iron Saturation: 25 % (ref 15–55)
Iron: 47 ug/dL (ref 38–169)
Total Iron Binding Capacity: 188 ug/dL — ABNORMAL LOW (ref 250–450)
UIBC: 141 ug/dL (ref 111–343)
Vitamin B-12: 1586 pg/mL — ABNORMAL HIGH (ref 232–1245)

## 2019-10-11 ENCOUNTER — Encounter: Payer: Self-pay | Admitting: Family Medicine

## 2019-10-14 NOTE — Addendum Note (Signed)
Addended by: Brynda Peon F on: 10/14/2019 11:54 AM   Modules accepted: Orders

## 2019-10-28 DIAGNOSIS — N186 End stage renal disease: Secondary | ICD-10-CM | POA: Diagnosis not present

## 2019-10-28 DIAGNOSIS — T8619 Other complication of kidney transplant: Secondary | ICD-10-CM | POA: Diagnosis not present

## 2019-10-28 DIAGNOSIS — N1831 Chronic kidney disease, stage 3a: Secondary | ICD-10-CM | POA: Diagnosis not present

## 2019-10-28 DIAGNOSIS — Z79899 Other long term (current) drug therapy: Secondary | ICD-10-CM | POA: Diagnosis not present

## 2019-12-01 DIAGNOSIS — D0439 Carcinoma in situ of skin of other parts of face: Secondary | ICD-10-CM | POA: Diagnosis not present

## 2019-12-01 DIAGNOSIS — Z85828 Personal history of other malignant neoplasm of skin: Secondary | ICD-10-CM | POA: Diagnosis not present

## 2019-12-01 DIAGNOSIS — L578 Other skin changes due to chronic exposure to nonionizing radiation: Secondary | ICD-10-CM | POA: Diagnosis not present

## 2019-12-01 DIAGNOSIS — C44212 Basal cell carcinoma of skin of right ear and external auricular canal: Secondary | ICD-10-CM | POA: Diagnosis not present

## 2019-12-01 DIAGNOSIS — L989 Disorder of the skin and subcutaneous tissue, unspecified: Secondary | ICD-10-CM | POA: Diagnosis not present

## 2019-12-01 DIAGNOSIS — L570 Actinic keratosis: Secondary | ICD-10-CM | POA: Diagnosis not present

## 2019-12-01 DIAGNOSIS — C44622 Squamous cell carcinoma of skin of right upper limb, including shoulder: Secondary | ICD-10-CM | POA: Diagnosis not present

## 2019-12-01 DIAGNOSIS — C44629 Squamous cell carcinoma of skin of left upper limb, including shoulder: Secondary | ICD-10-CM | POA: Diagnosis not present

## 2019-12-01 DIAGNOSIS — C44319 Basal cell carcinoma of skin of other parts of face: Secondary | ICD-10-CM | POA: Diagnosis not present

## 2019-12-22 DIAGNOSIS — T8619 Other complication of kidney transplant: Secondary | ICD-10-CM | POA: Diagnosis not present

## 2019-12-22 DIAGNOSIS — Z79899 Other long term (current) drug therapy: Secondary | ICD-10-CM | POA: Diagnosis not present

## 2019-12-22 DIAGNOSIS — M1A3710 Chronic gout due to renal impairment, right ankle and foot, without tophus (tophi): Secondary | ICD-10-CM | POA: Diagnosis not present

## 2019-12-22 DIAGNOSIS — N186 End stage renal disease: Secondary | ICD-10-CM | POA: Diagnosis not present

## 2019-12-22 DIAGNOSIS — M05732 Rheumatoid arthritis with rheumatoid factor of left wrist without organ or systems involvement: Secondary | ICD-10-CM | POA: Diagnosis not present

## 2019-12-22 DIAGNOSIS — M0579 Rheumatoid arthritis with rheumatoid factor of multiple sites without organ or systems involvement: Secondary | ICD-10-CM | POA: Diagnosis not present

## 2019-12-30 DIAGNOSIS — T8619 Other complication of kidney transplant: Secondary | ICD-10-CM | POA: Diagnosis not present

## 2019-12-30 DIAGNOSIS — Z992 Dependence on renal dialysis: Secondary | ICD-10-CM | POA: Diagnosis not present

## 2019-12-30 DIAGNOSIS — Z79899 Other long term (current) drug therapy: Secondary | ICD-10-CM | POA: Diagnosis not present

## 2019-12-30 DIAGNOSIS — Z940 Kidney transplant status: Secondary | ICD-10-CM | POA: Diagnosis not present

## 2019-12-30 DIAGNOSIS — Q613 Polycystic kidney, unspecified: Secondary | ICD-10-CM | POA: Diagnosis not present

## 2019-12-30 DIAGNOSIS — N186 End stage renal disease: Secondary | ICD-10-CM | POA: Diagnosis not present

## 2020-02-16 DIAGNOSIS — C44629 Squamous cell carcinoma of skin of left upper limb, including shoulder: Secondary | ICD-10-CM | POA: Diagnosis not present

## 2020-02-23 DIAGNOSIS — Z483 Aftercare following surgery for neoplasm: Secondary | ICD-10-CM | POA: Diagnosis not present

## 2020-02-23 DIAGNOSIS — C44629 Squamous cell carcinoma of skin of left upper limb, including shoulder: Secondary | ICD-10-CM | POA: Diagnosis not present

## 2020-03-02 DIAGNOSIS — C44629 Squamous cell carcinoma of skin of left upper limb, including shoulder: Secondary | ICD-10-CM | POA: Diagnosis not present

## 2020-03-02 DIAGNOSIS — Z483 Aftercare following surgery for neoplasm: Secondary | ICD-10-CM | POA: Diagnosis not present

## 2020-03-07 DIAGNOSIS — C44629 Squamous cell carcinoma of skin of left upper limb, including shoulder: Secondary | ICD-10-CM | POA: Diagnosis not present

## 2020-03-07 DIAGNOSIS — Z483 Aftercare following surgery for neoplasm: Secondary | ICD-10-CM | POA: Diagnosis not present

## 2020-03-07 DIAGNOSIS — B9562 Methicillin resistant Staphylococcus aureus infection as the cause of diseases classified elsewhere: Secondary | ICD-10-CM | POA: Diagnosis not present

## 2020-03-07 DIAGNOSIS — B952 Enterococcus as the cause of diseases classified elsewhere: Secondary | ICD-10-CM | POA: Diagnosis not present

## 2020-03-07 DIAGNOSIS — B954 Other streptococcus as the cause of diseases classified elsewhere: Secondary | ICD-10-CM | POA: Diagnosis not present

## 2020-03-07 DIAGNOSIS — L089 Local infection of the skin and subcutaneous tissue, unspecified: Secondary | ICD-10-CM | POA: Diagnosis not present

## 2020-03-07 DIAGNOSIS — B9689 Other specified bacterial agents as the cause of diseases classified elsewhere: Secondary | ICD-10-CM | POA: Diagnosis not present

## 2020-03-11 DIAGNOSIS — Z483 Aftercare following surgery for neoplasm: Secondary | ICD-10-CM | POA: Diagnosis not present

## 2020-03-11 DIAGNOSIS — C44622 Squamous cell carcinoma of skin of right upper limb, including shoulder: Secondary | ICD-10-CM | POA: Diagnosis not present

## 2020-04-01 DIAGNOSIS — M1A3710 Chronic gout due to renal impairment, right ankle and foot, without tophus (tophi): Secondary | ICD-10-CM | POA: Diagnosis not present

## 2020-04-01 DIAGNOSIS — M05732 Rheumatoid arthritis with rheumatoid factor of left wrist without organ or systems involvement: Secondary | ICD-10-CM | POA: Diagnosis not present

## 2020-04-01 DIAGNOSIS — Z940 Kidney transplant status: Secondary | ICD-10-CM | POA: Diagnosis not present

## 2020-04-01 DIAGNOSIS — J9601 Acute respiratory failure with hypoxia: Secondary | ICD-10-CM | POA: Diagnosis not present

## 2020-04-01 DIAGNOSIS — M1990 Unspecified osteoarthritis, unspecified site: Secondary | ICD-10-CM | POA: Diagnosis not present

## 2020-04-01 DIAGNOSIS — J1282 Pneumonia due to coronavirus disease 2019: Secondary | ICD-10-CM | POA: Diagnosis not present

## 2020-04-01 DIAGNOSIS — E875 Hyperkalemia: Secondary | ICD-10-CM | POA: Diagnosis not present

## 2020-04-01 DIAGNOSIS — E871 Hypo-osmolality and hyponatremia: Secondary | ICD-10-CM | POA: Diagnosis not present

## 2020-04-01 DIAGNOSIS — Z79899 Other long term (current) drug therapy: Secondary | ICD-10-CM | POA: Diagnosis not present

## 2020-04-01 DIAGNOSIS — U071 COVID-19: Secondary | ICD-10-CM | POA: Diagnosis not present

## 2020-04-01 DIAGNOSIS — I10 Essential (primary) hypertension: Secondary | ICD-10-CM | POA: Diagnosis not present

## 2020-04-01 DIAGNOSIS — J449 Chronic obstructive pulmonary disease, unspecified: Secondary | ICD-10-CM | POA: Diagnosis not present

## 2020-04-01 DIAGNOSIS — R0602 Shortness of breath: Secondary | ICD-10-CM | POA: Diagnosis not present

## 2020-04-02 DIAGNOSIS — J1282 Pneumonia due to coronavirus disease 2019: Secondary | ICD-10-CM | POA: Diagnosis not present

## 2020-04-02 DIAGNOSIS — J449 Chronic obstructive pulmonary disease, unspecified: Secondary | ICD-10-CM | POA: Diagnosis not present

## 2020-04-02 DIAGNOSIS — I10 Essential (primary) hypertension: Secondary | ICD-10-CM | POA: Diagnosis not present

## 2020-04-02 DIAGNOSIS — J9601 Acute respiratory failure with hypoxia: Secondary | ICD-10-CM | POA: Diagnosis not present

## 2020-04-02 DIAGNOSIS — Z940 Kidney transplant status: Secondary | ICD-10-CM | POA: Diagnosis not present

## 2020-04-02 DIAGNOSIS — M1990 Unspecified osteoarthritis, unspecified site: Secondary | ICD-10-CM | POA: Diagnosis not present

## 2020-04-03 DIAGNOSIS — M1990 Unspecified osteoarthritis, unspecified site: Secondary | ICD-10-CM | POA: Diagnosis not present

## 2020-04-03 DIAGNOSIS — Z940 Kidney transplant status: Secondary | ICD-10-CM | POA: Diagnosis not present

## 2020-04-03 DIAGNOSIS — J1282 Pneumonia due to coronavirus disease 2019: Secondary | ICD-10-CM | POA: Diagnosis not present

## 2020-04-03 DIAGNOSIS — J449 Chronic obstructive pulmonary disease, unspecified: Secondary | ICD-10-CM | POA: Diagnosis not present

## 2020-04-03 DIAGNOSIS — I10 Essential (primary) hypertension: Secondary | ICD-10-CM | POA: Diagnosis not present

## 2020-04-03 DIAGNOSIS — J9601 Acute respiratory failure with hypoxia: Secondary | ICD-10-CM | POA: Diagnosis not present

## 2020-04-04 DIAGNOSIS — J1282 Pneumonia due to coronavirus disease 2019: Secondary | ICD-10-CM | POA: Diagnosis not present

## 2020-04-04 DIAGNOSIS — J9601 Acute respiratory failure with hypoxia: Secondary | ICD-10-CM | POA: Diagnosis not present

## 2020-04-04 DIAGNOSIS — I10 Essential (primary) hypertension: Secondary | ICD-10-CM | POA: Diagnosis not present

## 2020-04-04 DIAGNOSIS — Z940 Kidney transplant status: Secondary | ICD-10-CM | POA: Diagnosis not present

## 2020-04-04 DIAGNOSIS — J449 Chronic obstructive pulmonary disease, unspecified: Secondary | ICD-10-CM | POA: Diagnosis not present

## 2020-04-04 DIAGNOSIS — M1990 Unspecified osteoarthritis, unspecified site: Secondary | ICD-10-CM | POA: Diagnosis not present

## 2020-04-05 DIAGNOSIS — J1282 Pneumonia due to coronavirus disease 2019: Secondary | ICD-10-CM | POA: Diagnosis not present

## 2020-04-05 DIAGNOSIS — Z940 Kidney transplant status: Secondary | ICD-10-CM | POA: Diagnosis not present

## 2020-04-05 DIAGNOSIS — J9601 Acute respiratory failure with hypoxia: Secondary | ICD-10-CM | POA: Diagnosis not present

## 2020-04-05 DIAGNOSIS — J449 Chronic obstructive pulmonary disease, unspecified: Secondary | ICD-10-CM | POA: Diagnosis not present

## 2020-04-05 DIAGNOSIS — I10 Essential (primary) hypertension: Secondary | ICD-10-CM | POA: Diagnosis not present

## 2020-04-05 DIAGNOSIS — M1990 Unspecified osteoarthritis, unspecified site: Secondary | ICD-10-CM | POA: Diagnosis not present

## 2020-04-06 DIAGNOSIS — J449 Chronic obstructive pulmonary disease, unspecified: Secondary | ICD-10-CM | POA: Diagnosis not present

## 2020-04-06 DIAGNOSIS — M1990 Unspecified osteoarthritis, unspecified site: Secondary | ICD-10-CM | POA: Diagnosis not present

## 2020-04-06 DIAGNOSIS — E875 Hyperkalemia: Secondary | ICD-10-CM | POA: Diagnosis not present

## 2020-04-06 DIAGNOSIS — J9601 Acute respiratory failure with hypoxia: Secondary | ICD-10-CM | POA: Diagnosis not present

## 2020-04-06 DIAGNOSIS — Z940 Kidney transplant status: Secondary | ICD-10-CM | POA: Diagnosis not present

## 2020-04-06 DIAGNOSIS — I10 Essential (primary) hypertension: Secondary | ICD-10-CM | POA: Diagnosis not present

## 2020-04-06 DIAGNOSIS — J1282 Pneumonia due to coronavirus disease 2019: Secondary | ICD-10-CM | POA: Diagnosis not present

## 2020-04-08 DIAGNOSIS — I10 Essential (primary) hypertension: Secondary | ICD-10-CM | POA: Diagnosis not present

## 2020-04-08 DIAGNOSIS — Z940 Kidney transplant status: Secondary | ICD-10-CM | POA: Diagnosis not present

## 2020-04-08 DIAGNOSIS — E871 Hypo-osmolality and hyponatremia: Secondary | ICD-10-CM | POA: Diagnosis not present

## 2020-04-08 DIAGNOSIS — J449 Chronic obstructive pulmonary disease, unspecified: Secondary | ICD-10-CM | POA: Diagnosis not present

## 2020-04-08 DIAGNOSIS — M1990 Unspecified osteoarthritis, unspecified site: Secondary | ICD-10-CM | POA: Diagnosis not present

## 2020-04-08 DIAGNOSIS — J1282 Pneumonia due to coronavirus disease 2019: Secondary | ICD-10-CM | POA: Diagnosis not present

## 2020-04-08 DIAGNOSIS — J9601 Acute respiratory failure with hypoxia: Secondary | ICD-10-CM | POA: Diagnosis not present

## 2020-04-08 DIAGNOSIS — E875 Hyperkalemia: Secondary | ICD-10-CM | POA: Diagnosis not present

## 2020-04-09 DIAGNOSIS — M1990 Unspecified osteoarthritis, unspecified site: Secondary | ICD-10-CM | POA: Diagnosis not present

## 2020-04-09 DIAGNOSIS — Z940 Kidney transplant status: Secondary | ICD-10-CM | POA: Diagnosis not present

## 2020-04-09 DIAGNOSIS — J449 Chronic obstructive pulmonary disease, unspecified: Secondary | ICD-10-CM | POA: Diagnosis not present

## 2020-04-09 DIAGNOSIS — E875 Hyperkalemia: Secondary | ICD-10-CM | POA: Diagnosis not present

## 2020-04-09 DIAGNOSIS — J1282 Pneumonia due to coronavirus disease 2019: Secondary | ICD-10-CM | POA: Diagnosis not present

## 2020-04-09 DIAGNOSIS — I10 Essential (primary) hypertension: Secondary | ICD-10-CM | POA: Diagnosis not present

## 2020-04-09 DIAGNOSIS — J9601 Acute respiratory failure with hypoxia: Secondary | ICD-10-CM | POA: Diagnosis not present

## 2020-04-09 DIAGNOSIS — E871 Hypo-osmolality and hyponatremia: Secondary | ICD-10-CM | POA: Diagnosis not present

## 2020-04-10 DIAGNOSIS — I10 Essential (primary) hypertension: Secondary | ICD-10-CM | POA: Diagnosis not present

## 2020-04-10 DIAGNOSIS — J1282 Pneumonia due to coronavirus disease 2019: Secondary | ICD-10-CM | POA: Diagnosis not present

## 2020-04-10 DIAGNOSIS — E871 Hypo-osmolality and hyponatremia: Secondary | ICD-10-CM | POA: Diagnosis not present

## 2020-04-10 DIAGNOSIS — E875 Hyperkalemia: Secondary | ICD-10-CM | POA: Diagnosis not present

## 2020-04-10 DIAGNOSIS — J449 Chronic obstructive pulmonary disease, unspecified: Secondary | ICD-10-CM | POA: Diagnosis not present

## 2020-04-10 DIAGNOSIS — M1990 Unspecified osteoarthritis, unspecified site: Secondary | ICD-10-CM | POA: Diagnosis not present

## 2020-04-10 DIAGNOSIS — Z940 Kidney transplant status: Secondary | ICD-10-CM | POA: Diagnosis not present

## 2020-04-10 DIAGNOSIS — J9601 Acute respiratory failure with hypoxia: Secondary | ICD-10-CM | POA: Diagnosis not present

## 2020-04-11 ENCOUNTER — Ambulatory Visit: Payer: BC Managed Care – PPO | Admitting: Family Medicine

## 2020-04-11 ENCOUNTER — Encounter: Payer: Self-pay | Admitting: Family Medicine

## 2020-05-01 ENCOUNTER — Other Ambulatory Visit: Payer: Self-pay | Admitting: Family Medicine

## 2020-05-03 DIAGNOSIS — C44319 Basal cell carcinoma of skin of other parts of face: Secondary | ICD-10-CM | POA: Diagnosis not present

## 2020-05-03 DIAGNOSIS — C44329 Squamous cell carcinoma of skin of other parts of face: Secondary | ICD-10-CM | POA: Diagnosis not present

## 2020-05-03 DIAGNOSIS — Z483 Aftercare following surgery for neoplasm: Secondary | ICD-10-CM | POA: Diagnosis not present

## 2020-05-03 DIAGNOSIS — C44212 Basal cell carcinoma of skin of right ear and external auricular canal: Secondary | ICD-10-CM | POA: Diagnosis not present

## 2020-05-03 DIAGNOSIS — C44622 Squamous cell carcinoma of skin of right upper limb, including shoulder: Secondary | ICD-10-CM | POA: Diagnosis not present

## 2020-05-03 DIAGNOSIS — C44629 Squamous cell carcinoma of skin of left upper limb, including shoulder: Secondary | ICD-10-CM | POA: Diagnosis not present

## 2020-05-15 ENCOUNTER — Other Ambulatory Visit: Payer: Self-pay | Admitting: Family Medicine

## 2020-06-22 ENCOUNTER — Ambulatory Visit: Payer: BC Managed Care – PPO | Admitting: Family Medicine

## 2020-06-24 ENCOUNTER — Encounter: Payer: BC Managed Care – PPO | Admitting: Family Medicine

## 2020-06-24 NOTE — Progress Notes (Signed)
Patient is unable to complete AWV at this time as he is visiting family.

## 2020-07-13 DIAGNOSIS — Z79899 Other long term (current) drug therapy: Secondary | ICD-10-CM | POA: Diagnosis not present

## 2020-07-13 DIAGNOSIS — N186 End stage renal disease: Secondary | ICD-10-CM | POA: Diagnosis not present

## 2020-07-13 DIAGNOSIS — Q613 Polycystic kidney, unspecified: Secondary | ICD-10-CM | POA: Diagnosis not present

## 2020-07-13 DIAGNOSIS — Z940 Kidney transplant status: Secondary | ICD-10-CM | POA: Diagnosis not present

## 2020-07-13 DIAGNOSIS — T8619 Other complication of kidney transplant: Secondary | ICD-10-CM | POA: Diagnosis not present

## 2020-08-11 DIAGNOSIS — T8619 Other complication of kidney transplant: Secondary | ICD-10-CM | POA: Diagnosis not present

## 2020-08-11 DIAGNOSIS — Z79899 Other long term (current) drug therapy: Secondary | ICD-10-CM | POA: Diagnosis not present

## 2020-08-11 DIAGNOSIS — M05732 Rheumatoid arthritis with rheumatoid factor of left wrist without organ or systems involvement: Secondary | ICD-10-CM | POA: Diagnosis not present

## 2020-08-11 DIAGNOSIS — M1A3710 Chronic gout due to renal impairment, right ankle and foot, without tophus (tophi): Secondary | ICD-10-CM | POA: Diagnosis not present

## 2020-08-11 DIAGNOSIS — N186 End stage renal disease: Secondary | ICD-10-CM | POA: Diagnosis not present

## 2020-09-09 ENCOUNTER — Ambulatory Visit (INDEPENDENT_AMBULATORY_CARE_PROVIDER_SITE_OTHER): Payer: BC Managed Care – PPO

## 2020-09-09 ENCOUNTER — Ambulatory Visit (INDEPENDENT_AMBULATORY_CARE_PROVIDER_SITE_OTHER): Payer: BC Managed Care – PPO | Admitting: Nurse Practitioner

## 2020-09-09 ENCOUNTER — Encounter: Payer: Self-pay | Admitting: Nurse Practitioner

## 2020-09-09 VITALS — Temp 98.7°F

## 2020-09-09 DIAGNOSIS — J449 Chronic obstructive pulmonary disease, unspecified: Secondary | ICD-10-CM | POA: Diagnosis not present

## 2020-09-09 DIAGNOSIS — R059 Cough, unspecified: Secondary | ICD-10-CM | POA: Diagnosis not present

## 2020-09-09 DIAGNOSIS — J069 Acute upper respiratory infection, unspecified: Secondary | ICD-10-CM

## 2020-09-09 MED ORDER — PREDNISONE 10 MG (21) PO TBPK: ORAL_TABLET | ORAL | 0 refills | Status: DC

## 2020-09-09 MED ORDER — PREDNISONE 10 MG (21) PO TBPK: ORAL_TABLET | ORAL | 0 refills | Status: AC

## 2020-09-09 MED ORDER — DM-GUAIFENESIN ER 30-600 MG PO TB12: 1.0000 | ORAL_TABLET | Freq: Two times a day (BID) | ORAL | 0 refills | Status: AC

## 2020-09-09 MED ORDER — BENZONATATE 100 MG PO CAPS: 100.0000 mg | ORAL_CAPSULE | Freq: Two times a day (BID) | ORAL | 0 refills | Status: AC | PRN

## 2020-09-09 NOTE — Assessment & Plan Note (Signed)
Unresolved symptoms of upper respiratory infection which worsening cough and congestion.  Patient is reporting coughing up thick yellow to green mucus chest congestion, and head congestion.  History of 3 pneumonia in the past, patient denies fever, sore throat ear pain nausea or vomiting.  COVID swab completed, chest x-ray to rule out pneumonia.  Prednisone taper, benzonatate for cough, guaifenesin for cough and congestion, Tylenol for headache,   Rx sent to pharmacy.

## 2020-09-09 NOTE — Progress Notes (Signed)
   Virtual Visit  Note Due to COVID-19 pandemic this visit was conducted virtually. This visit type was conducted due to national recommendations for restrictions regarding the COVID-19 Pandemic (e.g. social distancing, sheltering in place) in an effort to limit this patient's exposure and mitigate transmission in our community. All issues noted in this document were discussed and addressed.  A physical exam was not performed with this format.  I connected with Colton Todd on 09/09/20 at 09:16 am  by telephone and verified that I am speaking with the correct person using two identifiers. Colton Todd is currently located at home during visit. The provider, Ivy Lynn, NP is located in their office at time of visit.  I discussed the limitations, risks, security and privacy concerns of performing an evaluation and management service by telephone and the availability of in person appointments. I also discussed with the patient that there may be a patient responsible charge related to this service. The patient expressed understanding and agreed to proceed.   History and Present Illness:  HPI    ROS   Observations/Objective:  Televisit patient did not sound to be in distress.  Assessment and Plan:  Upper respiratory infection with cough and congestion Unresolved symptoms of upper respiratory infection which worsening cough and congestion.  Patient is reporting coughing up thick yellow to green mucus chest congestion, and head congestion.  History of 3 pneumonia in the past, patient denies fever, sore throat ear pain nausea or vomiting.  COVID swab completed, chest x-ray to rule out pneumonia.  Prednisone taper, benzonatate for cough, guaifenesin for cough and congestion, Tylenol for headache,   Rx sent to pharmacy.   Follow Up Instructions:  Follow-up with worsening or unresolved symptoms.    I discussed the assessment and treatment plan with the patient. The patient was  provided an opportunity to ask questions and all were answered. The patient agreed with the plan and demonstrated an understanding of the instructions.   The patient was advised to call back or seek an in-person evaluation if the symptoms worsen or if the condition fails to improve as anticipated.  The above assessment and management plan was discussed with the patient. The patient verbalized understanding of and has agreed to the management plan. Patient is aware to call the clinic if symptoms persist or worsen. Patient is aware when to return to the clinic for a follow-up visit. Patient educated on when it is appropriate to go to the emergency department.   Time call ended:    I provided 10 minutes of  non face-to-face time during this encounter.    Ivy Lynn, NP

## 2020-09-10 LAB — SARS-COV-2, NAA 2 DAY TAT

## 2020-09-10 LAB — NOVEL CORONAVIRUS, NAA: SARS-CoV-2, NAA: NOT DETECTED

## 2020-09-12 NOTE — Progress Notes (Signed)
Lmtcb.

## 2020-09-20 ENCOUNTER — Encounter: Payer: Self-pay | Admitting: *Deleted

## 2020-10-12 DIAGNOSIS — M109 Gout, unspecified: Secondary | ICD-10-CM | POA: Diagnosis not present

## 2020-10-12 DIAGNOSIS — D849 Immunodeficiency, unspecified: Secondary | ICD-10-CM | POA: Diagnosis not present

## 2020-10-12 DIAGNOSIS — T8619 Other complication of kidney transplant: Secondary | ICD-10-CM | POA: Diagnosis not present

## 2020-10-12 DIAGNOSIS — Z940 Kidney transplant status: Secondary | ICD-10-CM | POA: Diagnosis not present

## 2020-10-12 DIAGNOSIS — Z79899 Other long term (current) drug therapy: Secondary | ICD-10-CM | POA: Diagnosis not present

## 2020-10-12 DIAGNOSIS — Q613 Polycystic kidney, unspecified: Secondary | ICD-10-CM | POA: Diagnosis not present

## 2020-10-12 DIAGNOSIS — N1831 Chronic kidney disease, stage 3a: Secondary | ICD-10-CM | POA: Diagnosis not present

## 2020-10-12 DIAGNOSIS — I10 Essential (primary) hypertension: Secondary | ICD-10-CM | POA: Diagnosis not present

## 2020-10-12 DIAGNOSIS — N186 End stage renal disease: Secondary | ICD-10-CM | POA: Diagnosis not present

## 2020-11-01 DIAGNOSIS — S0501XA Injury of conjunctiva and corneal abrasion without foreign body, right eye, initial encounter: Secondary | ICD-10-CM | POA: Diagnosis not present

## 2020-11-01 DIAGNOSIS — H02105 Unspecified ectropion of left lower eyelid: Secondary | ICD-10-CM | POA: Diagnosis not present

## 2020-11-01 DIAGNOSIS — H02102 Unspecified ectropion of right lower eyelid: Secondary | ICD-10-CM | POA: Diagnosis not present

## 2020-11-01 DIAGNOSIS — H2513 Age-related nuclear cataract, bilateral: Secondary | ICD-10-CM | POA: Diagnosis not present

## 2020-11-03 DIAGNOSIS — S0501XD Injury of conjunctiva and corneal abrasion without foreign body, right eye, subsequent encounter: Secondary | ICD-10-CM | POA: Diagnosis not present

## 2020-12-13 DIAGNOSIS — L089 Local infection of the skin and subcutaneous tissue, unspecified: Secondary | ICD-10-CM | POA: Diagnosis not present

## 2020-12-13 DIAGNOSIS — D0462 Carcinoma in situ of skin of left upper limb, including shoulder: Secondary | ICD-10-CM | POA: Diagnosis not present

## 2020-12-13 DIAGNOSIS — L570 Actinic keratosis: Secondary | ICD-10-CM | POA: Diagnosis not present

## 2020-12-13 DIAGNOSIS — D0461 Carcinoma in situ of skin of right upper limb, including shoulder: Secondary | ICD-10-CM | POA: Diagnosis not present

## 2020-12-13 DIAGNOSIS — C44319 Basal cell carcinoma of skin of other parts of face: Secondary | ICD-10-CM | POA: Diagnosis not present

## 2020-12-13 DIAGNOSIS — Z9889 Other specified postprocedural states: Secondary | ICD-10-CM | POA: Diagnosis not present

## 2020-12-13 DIAGNOSIS — Z79899 Other long term (current) drug therapy: Secondary | ICD-10-CM | POA: Diagnosis not present

## 2020-12-13 DIAGNOSIS — C44629 Squamous cell carcinoma of skin of left upper limb, including shoulder: Secondary | ICD-10-CM | POA: Diagnosis not present

## 2020-12-20 DIAGNOSIS — T8619 Other complication of kidney transplant: Secondary | ICD-10-CM | POA: Diagnosis not present

## 2020-12-20 DIAGNOSIS — N186 End stage renal disease: Secondary | ICD-10-CM | POA: Diagnosis not present

## 2020-12-20 DIAGNOSIS — Z79899 Other long term (current) drug therapy: Secondary | ICD-10-CM | POA: Diagnosis not present

## 2020-12-20 DIAGNOSIS — M1A3710 Chronic gout due to renal impairment, right ankle and foot, without tophus (tophi): Secondary | ICD-10-CM | POA: Diagnosis not present

## 2020-12-20 DIAGNOSIS — M05732 Rheumatoid arthritis with rheumatoid factor of left wrist without organ or systems involvement: Secondary | ICD-10-CM | POA: Diagnosis not present

## 2020-12-23 ENCOUNTER — Ambulatory Visit (INDEPENDENT_AMBULATORY_CARE_PROVIDER_SITE_OTHER): Payer: BC Managed Care – PPO

## 2020-12-23 VITALS — Ht 70.0 in | Wt 150.0 lb

## 2020-12-23 DIAGNOSIS — M1990 Unspecified osteoarthritis, unspecified site: Secondary | ICD-10-CM | POA: Insufficient documentation

## 2020-12-23 DIAGNOSIS — Z0000 Encounter for general adult medical examination without abnormal findings: Secondary | ICD-10-CM

## 2020-12-23 DIAGNOSIS — J449 Chronic obstructive pulmonary disease, unspecified: Secondary | ICD-10-CM | POA: Insufficient documentation

## 2020-12-23 NOTE — Progress Notes (Signed)
Subjective:   Colton Todd is a 69 y.o. male who presents for Medicare Annual/Subsequent preventive examination.  Virtual Visit via Telephone Note  I connected with  Colton Todd on 12/23/20 at  2:45 PM EDT by telephone and verified that I am speaking with the correct person using two identifiers.  Location: Patient: Home Provider: [facility] Persons participating in the virtual visit: patient/Nurse Health Advisor   I discussed the limitations, risks, security and privacy concerns of performing an evaluation and management service by telephone and the availability of in person appointments. The patient expressed understanding and agreed to proceed.  Interactive audio and video telecommunications were attempted between this nurse and patient, however failed, due to patient having technical difficulties OR patient did not have access to video capability.  We continued and completed visit with audio only.  Some vital signs may be absent or patient reported.   Colton Todd E Ejay Lashley, LPN   Review of Systems     Cardiac Risk Factors include: advanced age (>48men, >73 women);Other (see comment);sedentary lifestyle;male gender, Risk factor comments: COPD, hx of renal transplant     Objective:    Today's Vitals   12/23/20 1439  Weight: 150 lb (68 kg)  Height: 5\' 10"  (1.778 m)  PainSc: 8    Body mass index is 21.52 kg/m.  Advanced Directives 08/31/2019 08/15/2019  Does Patient Have a Medical Advance Directive? No No    Current Medications (verified) Outpatient Encounter Medications as of 12/23/2020  Medication Sig   allopurinol (ZYLOPRIM) 300 MG tablet Take 300 mg by mouth daily.   diazepam (VALIUM) 5 MG tablet Take by mouth.   doxycycline (VIBRAMYCIN) 100 MG capsule Take 100 mg by mouth 2 (two) times daily.   famotidine (PEPCID) 20 MG tablet Take by mouth.   fluorouracil (EFUDEX) 5 % cream Apply twice daily for 10-14 days to the tops of the hands, forearms, face. Avoid sun while using.  Use a LOT of vaseline/aquaphor 2-4x daily to moisturize areas.   fluticasone (FLONASE) 50 MCG/ACT nasal spray SPRAY 2 SPRAYS INTO EACH NOSTRIL EVERY DAY   hydroxychloroquine (PLAQUENIL) 200 MG tablet Take by mouth.   K Phos Mono-Sod Phos Di & Mono (PHOSPHA 250 NEUTRAL) 155-852-130 MG TABS Take 1 tablet by mouth 2 (two) times daily.   loratadine (CLARITIN) 10 MG tablet 1 tablet   losartan (COZAAR) 50 MG tablet Take 100 mg by mouth daily.   metoprolol tartrate (LOPRESSOR) 25 MG tablet TAKE 1 TABLET BY MOUTH TWICE A DAY   Misc. Devices MISC Mr Dahlstrom has medical problems that require the constant use of sun glasses.   mycophenolate (MYFORTIC) 360 MG TBEC EC tablet TAKE 1 TABLET BY MOUTH TWICE A DAY   oxycodone (OXY-IR) 5 MG capsule Take 1 capsule (5 mg total) by mouth every 6 (six) hours as needed.   potassium chloride (K-DUR) 10 MEQ tablet TAKE TWO TABLETS (20 MEQ DOSE) BY MOUTH DAILY FOR 5 DAYS.   predniSONE (STERAPRED UNI-PAK 21 TAB) 10 MG (21) TBPK tablet 6 tablets day 1, 5 tablet day 2, 4 tablet day 3, 3 tablets day 4, 2 tablet day 5, 1 tablet day 6   pregabalin (LYRICA) 100 MG capsule Take 1 capsule (100 mg total) by mouth 2 (two) times daily.   Sirolimus (RAPAMUNE) 0.5 MG tablet TAKE ONE TABLET (0.5 MG DOSE) BY MOUTH EVERY MORNING. DX: Z94.0 KIDNEY TRANSPLANT   sodium bicarbonate 650 MG tablet Take 650 mg by mouth 4 (four) times daily.  Vitamin E 268 MG (400 UNIT) CAPS Take by mouth.   [DISCONTINUED] hydroxychloroquine (PLAQUENIL) 200 MG tablet 1 tablet   [DISCONTINUED] pregabalin (LYRICA) 100 MG capsule Take by mouth.   amLODipine (NORVASC) 5 MG tablet Take by mouth.   benzonatate (TESSALON) 100 MG capsule Take 1 capsule (100 mg total) by mouth 2 (two) times daily as needed for cough. (Patient not taking: Reported on 12/23/2020)   dextromethorphan-guaiFENesin (MUCINEX DM) 30-600 MG 12hr tablet Take 1 tablet by mouth 2 (two) times daily. (Patient not taking: Reported on 12/23/2020)    fluconazole (DIFLUCAN) 200 MG tablet 1 tablet (Patient not taking: Reported on 12/23/2020)   [DISCONTINUED] fluconazole (DIFLUCAN) 200 MG tablet Take 200 mg by mouth daily.    No facility-administered encounter medications on file as of 12/23/2020.    Allergies (verified) Codeine and Hydrocodone   History: Past Medical History:  Diagnosis Date   Chronic kidney disease    Hypertension    Past Surgical History:  Procedure Laterality Date   HERNIA REPAIR     KIDNEY TRANSPLANT Bilateral    Family History  Problem Relation Age of Onset   Heart disease Mother    Cancer Mother    Heart disease Father    Kidney disease Father    Social History   Socioeconomic History   Marital status: Married    Spouse name: Not on file   Number of children: Not on file   Years of education: Not on file   Highest education level: Not on file  Occupational History   Occupation: retired  Tobacco Use   Smoking status: Former   Smokeless tobacco: Current    Types: Chew  Substance and Sexual Activity   Alcohol use: Yes    Alcohol/week: 14.0 standard drinks    Types: 14 Cans of beer per week    Comment: 2-3 per day usually   Drug use: Yes    Types: Marijuana    Comment: daily   Sexual activity: Not on file  Other Topics Concern   Not on file  Social History Narrative   Not on file   Social Determinants of Health   Financial Resource Strain: Low Risk    Difficulty of Paying Living Expenses: Not hard at all  Food Insecurity: No Food Insecurity   Worried About Charity fundraiser in the Last Year: Never true   Ran Out of Food in the Last Year: Never true  Transportation Needs: No Transportation Needs   Lack of Transportation (Medical): No   Lack of Transportation (Non-Medical): No  Physical Activity: Insufficiently Active   Days of Exercise per Week: 7 days   Minutes of Exercise per Session: 10 min  Stress: No Stress Concern Present   Feeling of Stress : Not at all  Social  Connections: Moderately Isolated   Frequency of Communication with Friends and Family: More than three times a week   Frequency of Social Gatherings with Friends and Family: More than three times a week   Attends Religious Services: Never   Marine scientist or Organizations: No   Attends Music therapist: Never   Marital Status: Married    Tobacco Counseling Ready to quit: Not Answered Counseling given: Not Answered   Clinical Intake:  Pre-visit preparation completed: Yes  Pain : 0-10 Pain Score: 8  Pain Type: Chronic pain Pain Location: Generalized Pain Descriptors / Indicators: Aching, Discomfort Pain Onset: More than a month ago Pain Frequency: Intermittent  BMI - recorded: 21.52 Nutritional Status: BMI of 19-24  Normal Nutritional Risks: None Diabetes: No  How often do you need to have someone help you when you read instructions, pamphlets, or other written materials from your doctor or pharmacy?: 1 - Never  Diabetic? no  Interpreter Needed?: No  Information entered by :: Brittnee Gaetano, LPN   Activities of Daily Living In your present state of health, do you have any difficulty performing the following activities: 12/23/2020  Hearing? Y  Comment mild  Vision? N  Difficulty concentrating or making decisions? N  Walking or climbing stairs? N  Dressing or bathing? N  Doing errands, shopping? N  Preparing Food and eating ? N  Using the Toilet? N  In the past six months, have you accidently leaked urine? N  Do you have problems with loss of bowel control? N  Managing your Medications? N  Managing your Finances? N  Housekeeping or managing your Housekeeping? N  Some recent data might be hidden    Patient Care Team: [name] Fraise, MD as PCP - General (Family Medicine)  Indicate any recent Medical Services you may have received from other than [facility] providers in the past year (date may be approximate).     Assessment:   This is a  routine wellness examination for [place].  Hearing/Vision screen Hearing Screening - Comments:: C/o mild hearing difficulties - declines hearing aids Vision Screening - Comments:: Wears glasses prn - up to date with annual eye exams at [facility] issues and exercise activities discussed: Current Exercise Habits: Home exercise routine, Type of exercise: walking, Time (Minutes): 20, Frequency (Times/Week): 7, Weekly Exercise (Minutes/Week): 140, Intensity: Mild, Exercise limited by: orthopedic condition(s);respiratory conditions(s)   Goals Addressed             This Visit's Progress    Exercise 3x per week (30 min per time)         Depression Screen PHQ 2/9 Scores 12/23/2020 10/07/2019 11/26/2018 11/11/2018 04/30/2018 04/23/2018 01/17/2018  PHQ - 2 Score 0 0 0 0 1 0 0    Fall Risk Fall Risk  12/23/2020 10/07/2019 11/26/2018 11/11/2018 04/30/2018  Falls in the past year? 1 0 0 1 0  Number falls in past yr: 0 - - 0 -  Injury with Fall? 0 - - 0 -  Risk for fall due to : Impaired balance/gait;Medication side effect;Orthopedic patient - - - -  Follow up Education provided;Falls prevention discussed Falls evaluation completed - - -    FALL RISK PREVENTION PERTAINING TO THE HOME:  Any stairs in or around the home? Yes  If so, are there any without handrails? No  Home free of loose throw rugs in walkways, pet beds, electrical cords, etc? Yes  Adequate lighting in your home to reduce risk of falls? Yes   ASSISTIVE DEVICES UTILIZED TO PREVENT FALLS:  Life alert? No  Use of a cane, walker or w/c? Yes  Grab bars in the bathroom? Yes  Shower chair or bench in shower? Yes  Elevated toilet seat or a handicapped toilet? Yes   TIMED UP AND GO:  Was the test performed? No . Telephonic visit  Cognitive Function:        Immunizations Immunization History  Administered Date(s) Administered   Influenza Split 12/27/2014   Influenza,inj,quad, With Preservative 01/21/2017    Influenza,trivalent, recombinat, inj, PF 01/23/2016   Influenza-Unspecified 12/25/2017, 01/12/2019   Pneumococcal Conjugate-13 01/23/2016   Pneumococcal Polysaccharide-23 01/31/2016   Tdap 10/07/2019  TDAP status: Up to date  Flu Vaccine status: Due, Education has been provided regarding the importance of this vaccine. Advised may receive this vaccine at local pharmacy or Health Dept. Aware to provide a copy of the vaccination record if obtained from local pharmacy or Health Dept. Verbalized acceptance and understanding.  Pneumococcal vaccine status: Up to date  Covid-19 vaccine status: Completed vaccines  Qualifies for Shingles Vaccine? Yes   Zostavax completed No   Shingrix Completed?: No.    Education has been provided regarding the importance of this vaccine. Patient has been advised to call insurance company to determine out of pocket expense if they have not yet received this vaccine. Advised may also receive vaccine at local pharmacy or Health Dept. Verbalized acceptance and understanding.  Screening Tests Health Maintenance  Topic Date Due   COVID-19 Vaccine (1) Never done   Zoster Vaccines- Shingrix (1 of 2) Never done   COLONOSCOPY (Pts 45-75yrs Insurance coverage will need to be confirmed)  Never done   INFLUENZA VACCINE  10/31/2020   TETANUS/TDAP  10/06/2029   Hepatitis C Screening  Completed   HPV VACCINES  Aged Out    Health Maintenance  Health Maintenance Due  Topic Date Due   COVID-19 Vaccine (1) Never done   Zoster Vaccines- Shingrix (1 of 2) Never done   COLONOSCOPY (Pts 45-58yrs Insurance coverage will need to be confirmed)  Never done   INFLUENZA VACCINE  10/31/2020    Colorectal cancer screening: declined  Lung Cancer Screening: (Low Dose CT Chest recommended if Age 81-80 years, 30 pack-year currently smoking OR have quit w/in 15years.) does not qualify  Additional Screening:  Hepatitis C Screening: does qualify; Completed 02/02/1997  Vision  Screening: Recommended annual ophthalmology exams for early detection of glaucoma and other disorders of the eye. Is the patient up to date with their annual eye exam?  Yes  Who is the provider or what is the name of the office in which the patient attends annual eye exams? [facility] If pt is not established with a provider, would they like to be referred to a provider to establish care? No .   Dental Screening: Recommended annual dental exams for proper oral hygiene  Community Resource Referral / Chronic Care Management: CRR required this visit?  No   CCM required this visit?  No      Plan:     I have personally reviewed and noted the following in the patient's chart:   Medical and social history Use of alcohol, tobacco or illicit drugs  Current medications and supplements including opioid prescriptions. Patient is not currently taking opioid prescriptions. Functional ability and status Nutritional status Physical activity Advanced directives List of other physicians Hospitalizations, surgeries, and ER visits in previous 12 months Vitals Screenings to include cognitive, depression, and falls Referrals and appointments  In addition, I have reviewed and discussed with patient certain preventive protocols, quality metrics, and best practice recommendations. A written personalized care plan for preventive services as well as general preventive health recommendations were provided to patient.     Sandrea Hammond, LPN   [id number]   Nurse Notes: None

## 2020-12-23 NOTE — Patient Instructions (Signed)
Colton Todd , Thank you for taking time to come for your Medicare Wellness Visit. I appreciate your ongoing commitment to your health goals. Please review the following plan we discussed and let me know if I can assist you in the future.   Screening recommendations/referrals: Colonoscopy: declined Recommended yearly ophthalmology/optometry visit for glaucoma screening and checkup Recommended yearly dental visit for hygiene and checkup  Vaccinations: Influenza vaccine: Done 01/12/2019 - Repeat annually *due Pneumococcal vaccine: Done 01/23/2016 & 01/31/2016 Tdap vaccine: Done 10/07/2019 - Repeat in 10 years  Shingles vaccine: Due. Shingrix discussed. Please contact your pharmacy for coverage information.     Covid-19: Done - we need dates please  Advanced directives: Please bring a copy of your health care power of attorney and living will to the office to be added to your chart at your convenience.   Conditions/risks identified: Aim for 30 minutes of exercise or brisk walking each day, drink 6-8 glasses of water and eat lots of fruits and vegetables.  Next appointment: Follow up in one year for your annual wellness visit.   Preventive Care 68 Years and Older, Male  Preventive care refers to lifestyle choices and visits with your health care provider that can promote health and wellness. What does preventive care include? A yearly physical exam. This is also called an annual well check. Dental exams once or twice a year. Routine eye exams. Ask your health care provider how often you should have your eyes checked. Personal lifestyle choices, including: Daily care of your teeth and gums. Regular physical activity. Eating a healthy diet. Avoiding tobacco and drug use. Limiting alcohol use. Practicing safe sex. Taking low doses of aspirin every day. Taking vitamin and mineral supplements as recommended by your health care provider. What happens during an annual well check? The services  and screenings done by your health care provider during your annual well check will depend on your age, overall health, lifestyle risk factors, and family history of disease. Counseling  Your health care provider may ask you questions about your: Alcohol use. Tobacco use. Drug use. Emotional well-being. Home and relationship well-being. Sexual activity. Eating habits. History of falls. Memory and ability to understand (cognition). Work and work Statistician. Screening  You may have the following tests or measurements: Height, weight, and BMI. Blood pressure. Lipid and cholesterol levels. These may be checked every 5 years, or more frequently if you are over 45 years old. Skin check. Lung cancer screening. You may have this screening every year starting at age 68 if you have a 30-pack-year history of smoking and currently smoke or have quit within the past 15 years. Fecal occult blood test (FOBT) of the stool. You may have this test every year starting at age 68. Flexible sigmoidoscopy or colonoscopy. You may have a sigmoidoscopy every 5 years or a colonoscopy every 10 years starting at age 68. Prostate cancer screening. Recommendations will vary depending on your family history and other risks. Hepatitis C blood test. Hepatitis B blood test. Sexually transmitted disease (STD) testing. Diabetes screening. This is done by checking your blood sugar (glucose) after you have not eaten for a while (fasting). You may have this done every 1-3 years. Abdominal aortic aneurysm (AAA) screening. You may need this if you are a current or former smoker. Osteoporosis. You may be screened starting at age 68 if you are at high risk. Talk with your health care provider about your test results, treatment options, and if necessary, the need for more tests.  Vaccines  Your health care provider may recommend certain vaccines, such as: Influenza vaccine. This is recommended every year. Tetanus, diphtheria,  and acellular pertussis (Tdap, Td) vaccine. You may need a Td booster every 10 years. Zoster vaccine. You may need this after age 68. Pneumococcal 13-valent conjugate (PCV13) vaccine. One dose is recommended after age 68 Pneumococcal polysaccharide (PPSV23) vaccine. One dose is recommended after age 68. Talk to your health care provider about which screenings and vaccines you need and how often you need them. This information is not intended to replace advice given to you by your health care provider. Make sure you discuss any questions you have with your health care provider. Document Released: 04/15/2015 Document Revised: 12/07/2015 Document Reviewed: 01/18/2015 Elsevier Interactive Patient Education  2017 [place] Prevention in the Home Falls can cause injuries. They can happen to people of all ages. There are many things you can do to make your home safe and to help prevent falls. What can I do on the outside of my home? Regularly fix the edges of walkways and driveways and fix any cracks. Remove anything that might make you trip as you walk through a door, such as a raised step or threshold. Trim any bushes or trees on the path to your home. Use bright outdoor lighting. Clear any walking paths of anything that might make someone trip, such as rocks or tools. Regularly check to see if handrails are loose or broken. Make sure that both sides of any steps have handrails. Any raised decks and porches should have guardrails on the edges. Have any leaves, snow, or ice cleared regularly. Use sand or salt on walking paths during winter. Clean up any spills in your garage right away. This includes oil or grease spills. What can I do in the bathroom? Use night lights. Install grab bars by the toilet and in the tub and shower. Do not use towel bars as grab bars. Use non-skid mats or decals in the tub or shower. If you need to sit down in the shower, use a plastic, non-slip  stool. Keep the floor dry. Clean up any water that spills on the floor as soon as it happens. Remove soap buildup in the tub or shower regularly. Attach bath mats securely with double-sided non-slip rug tape. Do not have throw rugs and other things on the floor that can make you trip. What can I do in the bedroom? Use night lights. Make sure that you have a light by your bed that is easy to reach. Do not use any sheets or blankets that are too big for your bed. They should not hang down onto the floor. Have a firm chair that has side arms. You can use this for support while you get dressed. Do not have throw rugs and other things on the floor that can make you trip. What can I do in the kitchen? Clean up any spills right away. Avoid walking on wet floors. Keep items that you use a lot in easy-to-reach places. If you need to reach something above you, use a strong step stool that has a grab bar. Keep electrical cords out of the way. Do not use floor polish or wax that makes floors slippery. If you must use wax, use non-skid floor wax. Do not have throw rugs and other things on the floor that can make you trip. What can I do with my stairs? Do not leave any items on the stairs. Make sure that  there are handrails on both sides of the stairs and use them. Fix handrails that are broken or loose. Make sure that handrails are as long as the stairways. Check any carpeting to make sure that it is firmly attached to the stairs. Fix any carpet that is loose or worn. Avoid having throw rugs at the top or bottom of the stairs. If you do have throw rugs, attach them to the floor with carpet tape. Make sure that you have a light switch at the top of the stairs and the bottom of the stairs. If you do not have them, ask someone to add them for you. What else can I do to help prevent falls? Wear shoes that: Do not have high heels. Have rubber bottoms. Are comfortable and fit you well. Are closed at the  toe. Do not wear sandals. If you use a stepladder: Make sure that it is fully opened. Do not climb a closed stepladder. Make sure that both sides of the stepladder are locked into place. Ask someone to hold it for you, if possible. Clearly mark and make sure that you can see: Any grab bars or handrails. First and last steps. Where the edge of each step is. Use tools that help you move around (mobility aids) if they are needed. These include: Canes. Walkers. Scooters. Crutches. Turn on the lights when you go into a dark area. Replace any light bulbs as soon as they burn out. Set up your furniture so you have a clear path. Avoid moving your furniture around. If any of your floors are uneven, fix them. If there are any pets around you, be aware of where they are. Review your medicines with your doctor. Some medicines can make you feel dizzy. This can increase your chance of falling. Ask your doctor what other things that you can do to help prevent falls. This information is not intended to replace advice given to you by your health care provider. Make sure you discuss any questions you have with your health care provider. Document Released: 01/13/2009 Document Revised: 08/25/2015 Document Reviewed: 04/23/2014 Elsevier Interactive Patient Education  2017 [employer].

## 2021-01-19 DIAGNOSIS — N186 End stage renal disease: Secondary | ICD-10-CM | POA: Diagnosis not present

## 2021-01-19 DIAGNOSIS — Q613 Polycystic kidney, unspecified: Secondary | ICD-10-CM | POA: Diagnosis not present

## 2021-01-19 DIAGNOSIS — Z79899 Other long term (current) drug therapy: Secondary | ICD-10-CM | POA: Diagnosis not present

## 2021-01-19 DIAGNOSIS — N1831 Chronic kidney disease, stage 3a: Secondary | ICD-10-CM | POA: Diagnosis not present

## 2021-01-19 DIAGNOSIS — Z23 Encounter for immunization: Secondary | ICD-10-CM | POA: Diagnosis not present

## 2021-01-19 DIAGNOSIS — T8619 Other complication of kidney transplant: Secondary | ICD-10-CM | POA: Diagnosis not present

## 2021-01-19 DIAGNOSIS — Z940 Kidney transplant status: Secondary | ICD-10-CM | POA: Diagnosis not present

## 2021-01-19 DIAGNOSIS — I10 Essential (primary) hypertension: Secondary | ICD-10-CM | POA: Diagnosis not present

## 2021-02-14 DIAGNOSIS — Z85828 Personal history of other malignant neoplasm of skin: Secondary | ICD-10-CM | POA: Diagnosis not present

## 2021-02-14 DIAGNOSIS — L578 Other skin changes due to chronic exposure to nonionizing radiation: Secondary | ICD-10-CM | POA: Diagnosis not present

## 2021-02-14 DIAGNOSIS — C44329 Squamous cell carcinoma of skin of other parts of face: Secondary | ICD-10-CM | POA: Diagnosis not present

## 2021-02-14 DIAGNOSIS — L570 Actinic keratosis: Secondary | ICD-10-CM | POA: Diagnosis not present

## 2021-02-14 DIAGNOSIS — C44319 Basal cell carcinoma of skin of other parts of face: Secondary | ICD-10-CM | POA: Diagnosis not present

## 2021-02-14 DIAGNOSIS — C44212 Basal cell carcinoma of skin of right ear and external auricular canal: Secondary | ICD-10-CM | POA: Diagnosis not present

## 2021-02-14 DIAGNOSIS — C44622 Squamous cell carcinoma of skin of right upper limb, including shoulder: Secondary | ICD-10-CM | POA: Diagnosis not present

## 2021-02-14 DIAGNOSIS — D849 Immunodeficiency, unspecified: Secondary | ICD-10-CM | POA: Diagnosis not present

## 2021-05-10 DIAGNOSIS — I10 Essential (primary) hypertension: Secondary | ICD-10-CM | POA: Diagnosis not present

## 2021-05-10 DIAGNOSIS — T8619 Other complication of kidney transplant: Secondary | ICD-10-CM | POA: Diagnosis not present

## 2021-05-10 DIAGNOSIS — N186 End stage renal disease: Secondary | ICD-10-CM | POA: Diagnosis not present

## 2021-05-10 DIAGNOSIS — Q613 Polycystic kidney, unspecified: Secondary | ICD-10-CM | POA: Diagnosis not present

## 2021-05-10 DIAGNOSIS — Z940 Kidney transplant status: Secondary | ICD-10-CM | POA: Diagnosis not present

## 2021-05-10 DIAGNOSIS — Z79899 Other long term (current) drug therapy: Secondary | ICD-10-CM | POA: Diagnosis not present

## 2021-05-10 DIAGNOSIS — D849 Immunodeficiency, unspecified: Secondary | ICD-10-CM | POA: Diagnosis not present

## 2021-05-10 DIAGNOSIS — M109 Gout, unspecified: Secondary | ICD-10-CM | POA: Diagnosis not present

## 2021-06-19 DIAGNOSIS — N186 End stage renal disease: Secondary | ICD-10-CM | POA: Diagnosis not present

## 2021-06-19 DIAGNOSIS — M1A3710 Chronic gout due to renal impairment, right ankle and foot, without tophus (tophi): Secondary | ICD-10-CM | POA: Diagnosis not present

## 2021-06-19 DIAGNOSIS — M05732 Rheumatoid arthritis with rheumatoid factor of left wrist without organ or systems involvement: Secondary | ICD-10-CM | POA: Diagnosis not present

## 2021-06-19 DIAGNOSIS — Z79899 Other long term (current) drug therapy: Secondary | ICD-10-CM | POA: Diagnosis not present

## 2021-06-19 DIAGNOSIS — T8619 Other complication of kidney transplant: Secondary | ICD-10-CM | POA: Diagnosis not present

## 2021-08-09 DIAGNOSIS — N186 End stage renal disease: Secondary | ICD-10-CM | POA: Diagnosis not present

## 2021-08-09 DIAGNOSIS — N1831 Chronic kidney disease, stage 3a: Secondary | ICD-10-CM | POA: Diagnosis not present

## 2021-08-09 DIAGNOSIS — Z79899 Other long term (current) drug therapy: Secondary | ICD-10-CM | POA: Diagnosis not present

## 2021-08-09 DIAGNOSIS — Z940 Kidney transplant status: Secondary | ICD-10-CM | POA: Diagnosis not present

## 2021-08-09 DIAGNOSIS — I10 Essential (primary) hypertension: Secondary | ICD-10-CM | POA: Diagnosis not present

## 2021-08-09 DIAGNOSIS — T8619 Other complication of kidney transplant: Secondary | ICD-10-CM | POA: Diagnosis not present

## 2021-08-17 DIAGNOSIS — D049 Carcinoma in situ of skin, unspecified: Secondary | ICD-10-CM | POA: Diagnosis not present

## 2021-08-17 DIAGNOSIS — C4491 Basal cell carcinoma of skin, unspecified: Secondary | ICD-10-CM | POA: Diagnosis not present

## 2021-08-17 DIAGNOSIS — Z85828 Personal history of other malignant neoplasm of skin: Secondary | ICD-10-CM | POA: Diagnosis not present

## 2021-08-17 DIAGNOSIS — Z940 Kidney transplant status: Secondary | ICD-10-CM | POA: Diagnosis not present

## 2021-08-17 DIAGNOSIS — C44622 Squamous cell carcinoma of skin of right upper limb, including shoulder: Secondary | ICD-10-CM | POA: Diagnosis not present

## 2021-09-19 IMAGING — DX DG CERVICAL SPINE COMPLETE 4+V
5 series · 5 of 5 positions shown · non-contrast
Comparison: 09/16/2014

CLINICAL DATA: Pt reports pain to LUE, LT shoulder and LT side neck
and clavicle since [REDACTED]; no injury; Jonisvaldo he was having difficulty
moving it yesterday and had to pick it up with his other hand

EXAM:
CERVICAL SPINE - COMPLETE 4+ VIEW

[c-spine lat]
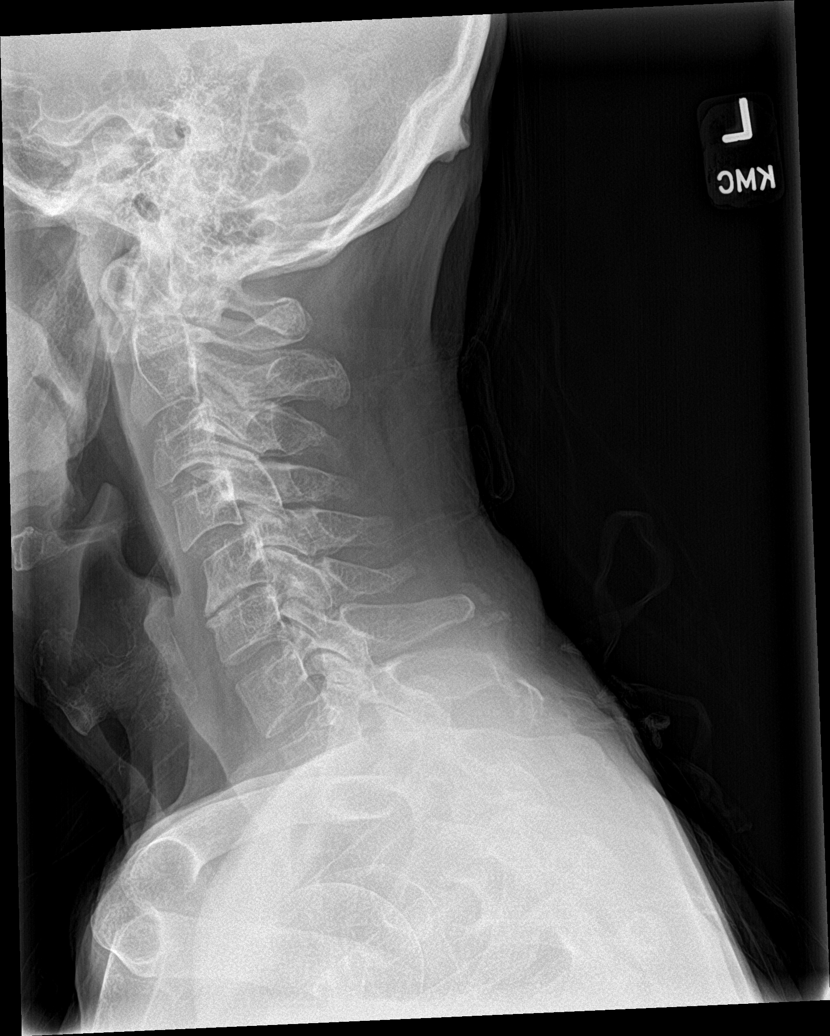

[c-spine obl (1 of 2)]
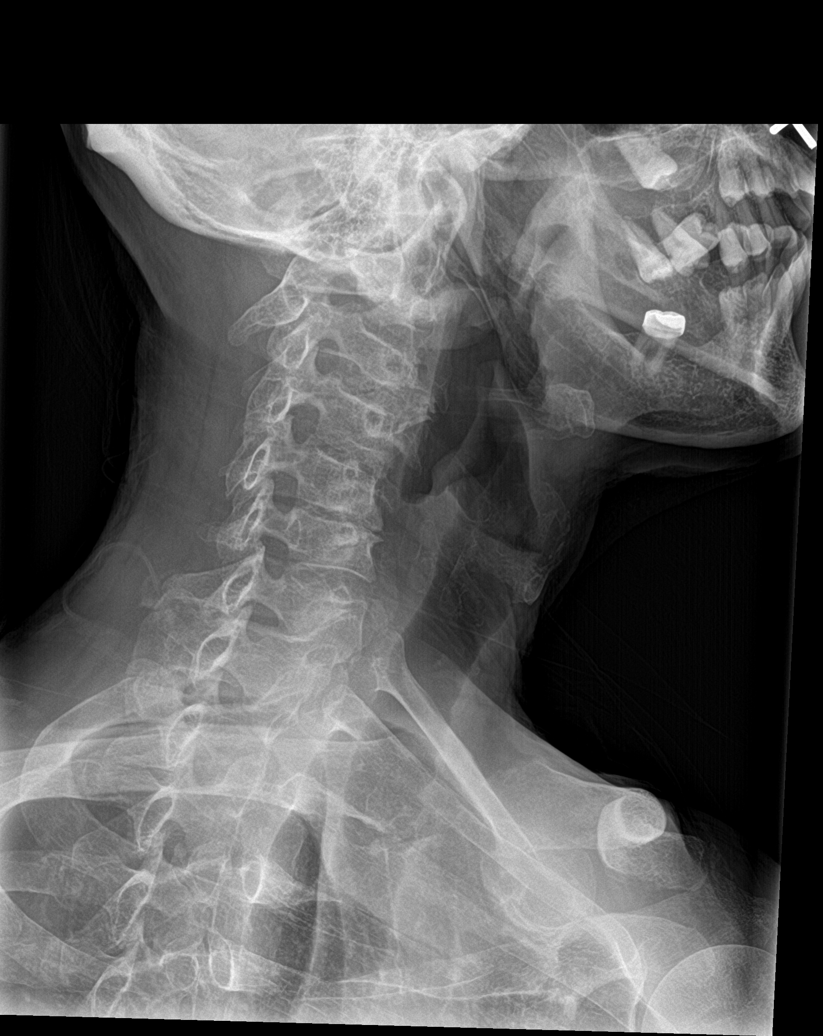

[c-spine obl (2 of 2)]
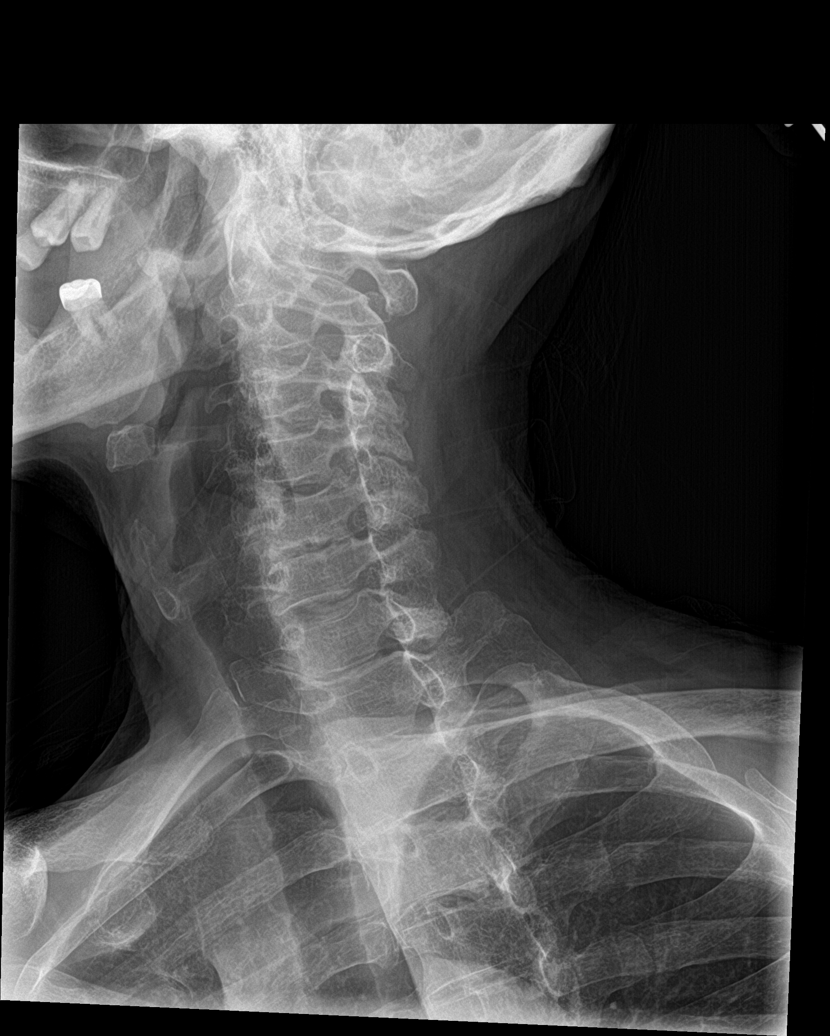

[c-spine ap]
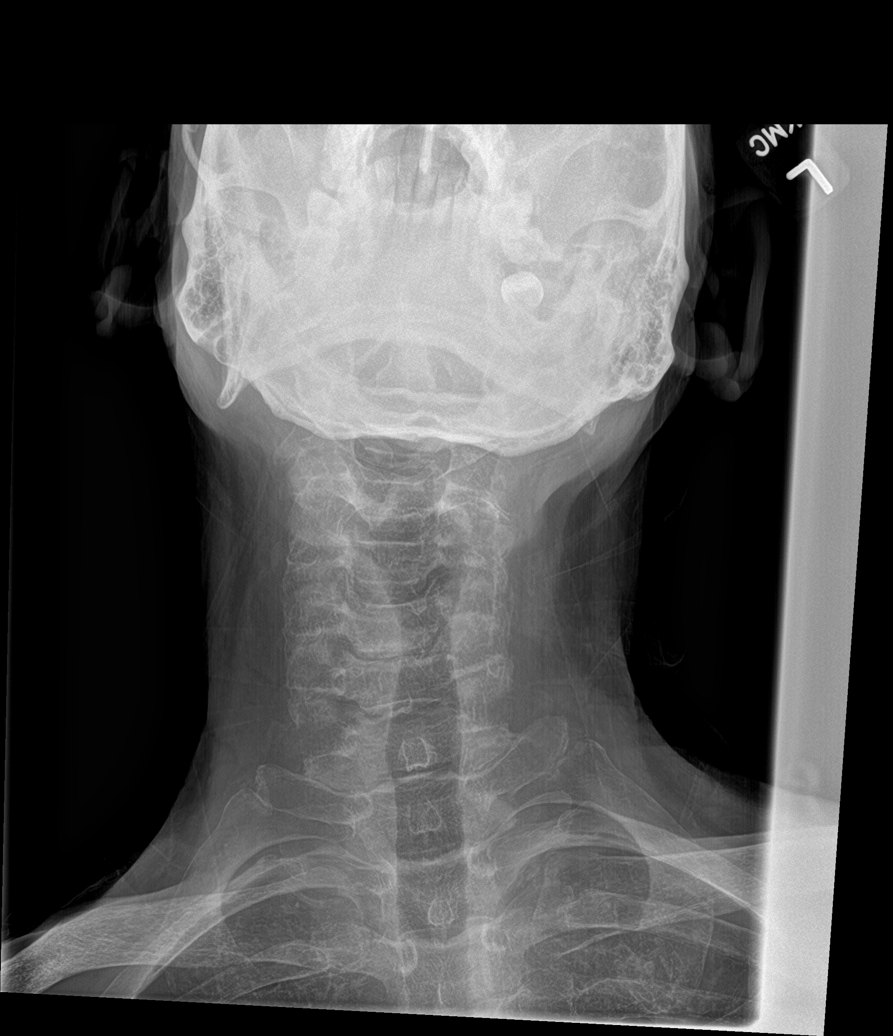

[c-spine open mouth]
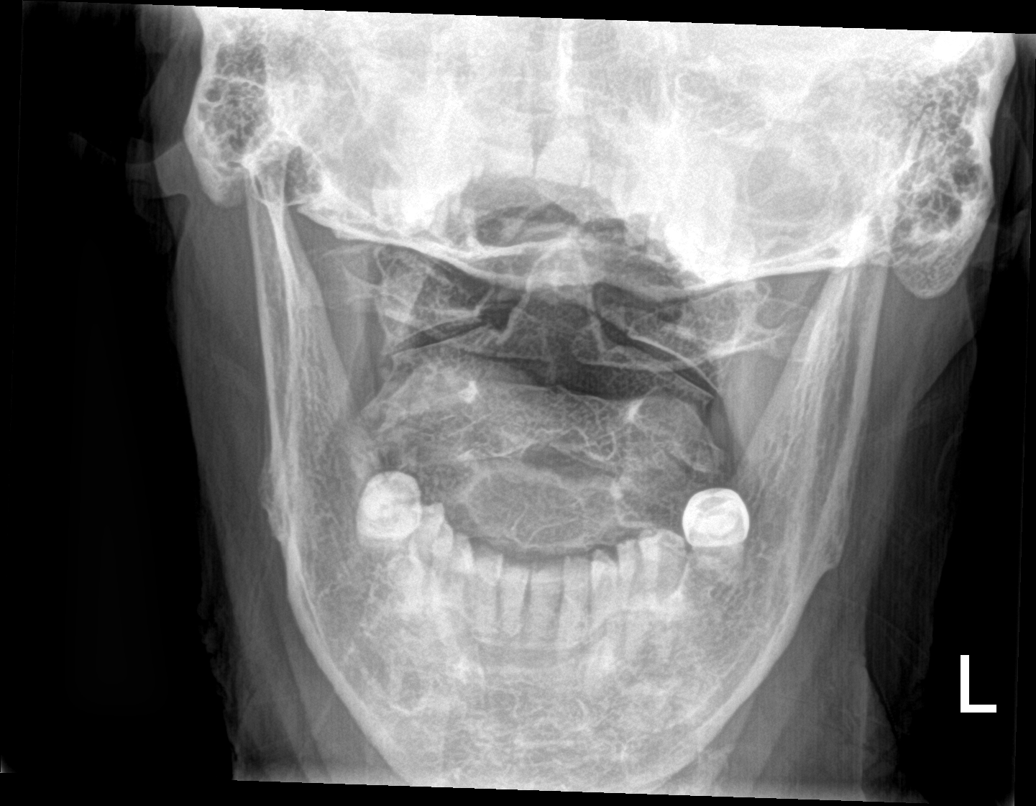

[5 of 5 positions shown; findings below may reference images not displayed]

FINDINGS: No fracture or bone lesion.

Grade 1 anterolisthesis of C4 on C5. Slight anterolisthesis of C3
and C4. Straightened cervical lordosis.

Moderate to marked loss of disc height at C5-C6. Remaining cervical
discs are well preserved in height.

No significant neural foraminal narrowing.

Soft tissues are unremarkable.

Skeletal structures are demineralized.
IMPRESSION: 1. No fracture or acute finding.
2. Degenerative changes as detailed, greatest at C5-C6 which has
mildly progressed when compared to the prior radiographs.

## 2021-10-26 DIAGNOSIS — M1A3710 Chronic gout due to renal impairment, right ankle and foot, without tophus (tophi): Secondary | ICD-10-CM | POA: Diagnosis not present

## 2021-10-26 DIAGNOSIS — N186 End stage renal disease: Secondary | ICD-10-CM | POA: Diagnosis not present

## 2021-10-26 DIAGNOSIS — T8619 Other complication of kidney transplant: Secondary | ICD-10-CM | POA: Diagnosis not present

## 2021-10-26 DIAGNOSIS — Z79899 Other long term (current) drug therapy: Secondary | ICD-10-CM | POA: Diagnosis not present

## 2021-10-26 DIAGNOSIS — M05732 Rheumatoid arthritis with rheumatoid factor of left wrist without organ or systems involvement: Secondary | ICD-10-CM | POA: Diagnosis not present

## 2021-11-13 DIAGNOSIS — T8619 Other complication of kidney transplant: Secondary | ICD-10-CM | POA: Diagnosis not present

## 2021-11-13 DIAGNOSIS — M109 Gout, unspecified: Secondary | ICD-10-CM | POA: Diagnosis not present

## 2021-11-13 DIAGNOSIS — I10 Essential (primary) hypertension: Secondary | ICD-10-CM | POA: Diagnosis not present

## 2021-11-13 DIAGNOSIS — Z79899 Other long term (current) drug therapy: Secondary | ICD-10-CM | POA: Diagnosis not present

## 2021-11-13 DIAGNOSIS — Z940 Kidney transplant status: Secondary | ICD-10-CM | POA: Diagnosis not present

## 2021-11-13 DIAGNOSIS — Q613 Polycystic kidney, unspecified: Secondary | ICD-10-CM | POA: Diagnosis not present

## 2021-11-13 DIAGNOSIS — N1831 Chronic kidney disease, stage 3a: Secondary | ICD-10-CM | POA: Diagnosis not present

## 2021-11-13 DIAGNOSIS — D849 Immunodeficiency, unspecified: Secondary | ICD-10-CM | POA: Diagnosis not present

## 2021-11-13 DIAGNOSIS — N186 End stage renal disease: Secondary | ICD-10-CM | POA: Diagnosis not present

## 2021-12-21 DIAGNOSIS — C44319 Basal cell carcinoma of skin of other parts of face: Secondary | ICD-10-CM | POA: Diagnosis not present

## 2021-12-21 DIAGNOSIS — C44311 Basal cell carcinoma of skin of nose: Secondary | ICD-10-CM | POA: Diagnosis not present

## 2021-12-21 DIAGNOSIS — C4401 Basal cell carcinoma of skin of lip: Secondary | ICD-10-CM | POA: Diagnosis not present

## 2021-12-21 DIAGNOSIS — Z85828 Personal history of other malignant neoplasm of skin: Secondary | ICD-10-CM | POA: Diagnosis not present

## 2021-12-21 DIAGNOSIS — C44629 Squamous cell carcinoma of skin of left upper limb, including shoulder: Secondary | ICD-10-CM | POA: Diagnosis not present

## 2021-12-21 DIAGNOSIS — C44111 Basal cell carcinoma of skin of unspecified eyelid, including canthus: Secondary | ICD-10-CM | POA: Diagnosis not present

## 2021-12-21 DIAGNOSIS — C44612 Basal cell carcinoma of skin of right upper limb, including shoulder: Secondary | ICD-10-CM | POA: Diagnosis not present

## 2021-12-21 DIAGNOSIS — C44519 Basal cell carcinoma of skin of other part of trunk: Secondary | ICD-10-CM | POA: Diagnosis not present

## 2021-12-21 DIAGNOSIS — C44622 Squamous cell carcinoma of skin of right upper limb, including shoulder: Secondary | ICD-10-CM | POA: Diagnosis not present

## 2021-12-21 DIAGNOSIS — C4441 Basal cell carcinoma of skin of scalp and neck: Secondary | ICD-10-CM | POA: Diagnosis not present

## 2021-12-21 DIAGNOSIS — Z940 Kidney transplant status: Secondary | ICD-10-CM | POA: Diagnosis not present

## 2022-01-27 DIAGNOSIS — R0981 Nasal congestion: Secondary | ICD-10-CM | POA: Diagnosis not present

## 2022-02-14 DIAGNOSIS — T8619 Other complication of kidney transplant: Secondary | ICD-10-CM | POA: Diagnosis not present

## 2022-02-14 DIAGNOSIS — N186 End stage renal disease: Secondary | ICD-10-CM | POA: Diagnosis not present

## 2022-02-14 DIAGNOSIS — Z79899 Other long term (current) drug therapy: Secondary | ICD-10-CM | POA: Diagnosis not present

## 2022-02-14 DIAGNOSIS — Z940 Kidney transplant status: Secondary | ICD-10-CM | POA: Diagnosis not present

## 2022-02-14 DIAGNOSIS — I10 Essential (primary) hypertension: Secondary | ICD-10-CM | POA: Diagnosis not present

## 2022-02-14 DIAGNOSIS — D849 Immunodeficiency, unspecified: Secondary | ICD-10-CM | POA: Diagnosis not present

## 2022-02-21 DIAGNOSIS — M545 Low back pain, unspecified: Secondary | ICD-10-CM | POA: Diagnosis not present

## 2022-02-21 DIAGNOSIS — Y929 Unspecified place or not applicable: Secondary | ICD-10-CM | POA: Diagnosis not present

## 2022-02-21 DIAGNOSIS — S3992XA Unspecified injury of lower back, initial encounter: Secondary | ICD-10-CM | POA: Diagnosis not present

## 2022-02-21 DIAGNOSIS — Y999 Unspecified external cause status: Secondary | ICD-10-CM | POA: Diagnosis not present

## 2022-02-21 DIAGNOSIS — Z720 Tobacco use: Secondary | ICD-10-CM | POA: Diagnosis not present

## 2022-02-21 DIAGNOSIS — F1290 Cannabis use, unspecified, uncomplicated: Secondary | ICD-10-CM | POA: Diagnosis not present

## 2022-02-21 DIAGNOSIS — Y939 Activity, unspecified: Secondary | ICD-10-CM | POA: Diagnosis not present

## 2022-02-21 DIAGNOSIS — S329XXA Fracture of unspecified parts of lumbosacral spine and pelvis, initial encounter for closed fracture: Secondary | ICD-10-CM | POA: Diagnosis not present

## 2022-02-21 DIAGNOSIS — W010XXA Fall on same level from slipping, tripping and stumbling without subsequent striking against object, initial encounter: Secondary | ICD-10-CM | POA: Diagnosis not present

## 2022-02-21 DIAGNOSIS — J449 Chronic obstructive pulmonary disease, unspecified: Secondary | ICD-10-CM | POA: Diagnosis not present

## 2022-02-21 DIAGNOSIS — I10 Essential (primary) hypertension: Secondary | ICD-10-CM | POA: Diagnosis not present

## 2022-02-23 DIAGNOSIS — M1990 Unspecified osteoarthritis, unspecified site: Secondary | ICD-10-CM | POA: Diagnosis not present

## 2022-02-23 DIAGNOSIS — R112 Nausea with vomiting, unspecified: Secondary | ICD-10-CM | POA: Diagnosis not present

## 2022-02-23 DIAGNOSIS — K529 Noninfective gastroenteritis and colitis, unspecified: Secondary | ICD-10-CM | POA: Diagnosis not present

## 2022-02-23 DIAGNOSIS — N179 Acute kidney failure, unspecified: Secondary | ICD-10-CM | POA: Diagnosis not present

## 2022-02-23 DIAGNOSIS — R531 Weakness: Secondary | ICD-10-CM | POA: Diagnosis not present

## 2022-02-23 DIAGNOSIS — I10 Essential (primary) hypertension: Secondary | ICD-10-CM | POA: Diagnosis not present

## 2022-02-23 DIAGNOSIS — J449 Chronic obstructive pulmonary disease, unspecified: Secondary | ICD-10-CM | POA: Diagnosis not present

## 2022-02-23 DIAGNOSIS — R339 Retention of urine, unspecified: Secondary | ICD-10-CM | POA: Diagnosis not present

## 2022-02-26 DIAGNOSIS — Q613 Polycystic kidney, unspecified: Secondary | ICD-10-CM | POA: Diagnosis not present

## 2022-02-26 DIAGNOSIS — J449 Chronic obstructive pulmonary disease, unspecified: Secondary | ICD-10-CM | POA: Diagnosis not present

## 2022-02-26 DIAGNOSIS — N179 Acute kidney failure, unspecified: Secondary | ICD-10-CM | POA: Diagnosis not present

## 2022-02-26 DIAGNOSIS — K56609 Unspecified intestinal obstruction, unspecified as to partial versus complete obstruction: Secondary | ICD-10-CM | POA: Diagnosis not present

## 2022-02-26 DIAGNOSIS — Z940 Kidney transplant status: Secondary | ICD-10-CM | POA: Diagnosis not present

## 2022-02-26 DIAGNOSIS — J189 Pneumonia, unspecified organism: Secondary | ICD-10-CM | POA: Diagnosis not present

## 2022-02-26 DIAGNOSIS — I10 Essential (primary) hypertension: Secondary | ICD-10-CM | POA: Diagnosis not present

## 2022-02-26 DIAGNOSIS — R339 Retention of urine, unspecified: Secondary | ICD-10-CM | POA: Diagnosis not present

## 2022-02-27 DIAGNOSIS — K56609 Unspecified intestinal obstruction, unspecified as to partial versus complete obstruction: Secondary | ICD-10-CM | POA: Diagnosis not present

## 2022-02-27 DIAGNOSIS — N179 Acute kidney failure, unspecified: Secondary | ICD-10-CM | POA: Diagnosis not present

## 2022-02-27 DIAGNOSIS — K56600 Partial intestinal obstruction, unspecified as to cause: Secondary | ICD-10-CM | POA: Diagnosis not present

## 2022-02-27 DIAGNOSIS — J189 Pneumonia, unspecified organism: Secondary | ICD-10-CM | POA: Diagnosis not present

## 2022-02-27 DIAGNOSIS — R339 Retention of urine, unspecified: Secondary | ICD-10-CM | POA: Diagnosis not present

## 2022-02-28 DIAGNOSIS — R590 Localized enlarged lymph nodes: Secondary | ICD-10-CM | POA: Diagnosis not present

## 2022-02-28 DIAGNOSIS — R339 Retention of urine, unspecified: Secondary | ICD-10-CM | POA: Diagnosis not present

## 2022-02-28 DIAGNOSIS — K56609 Unspecified intestinal obstruction, unspecified as to partial versus complete obstruction: Secondary | ICD-10-CM | POA: Diagnosis not present

## 2022-02-28 DIAGNOSIS — R918 Other nonspecific abnormal finding of lung field: Secondary | ICD-10-CM | POA: Diagnosis not present

## 2022-02-28 DIAGNOSIS — J189 Pneumonia, unspecified organism: Secondary | ICD-10-CM | POA: Diagnosis not present

## 2022-02-28 DIAGNOSIS — K8020 Calculus of gallbladder without cholecystitis without obstruction: Secondary | ICD-10-CM | POA: Diagnosis not present

## 2022-02-28 DIAGNOSIS — N179 Acute kidney failure, unspecified: Secondary | ICD-10-CM | POA: Diagnosis not present

## 2022-02-28 DIAGNOSIS — K7689 Other specified diseases of liver: Secondary | ICD-10-CM | POA: Diagnosis not present

## 2022-02-28 DIAGNOSIS — R0902 Hypoxemia: Secondary | ICD-10-CM | POA: Diagnosis not present

## 2022-02-28 DIAGNOSIS — Z4682 Encounter for fitting and adjustment of non-vascular catheter: Secondary | ICD-10-CM | POA: Diagnosis not present

## 2022-03-01 DIAGNOSIS — Z940 Kidney transplant status: Secondary | ICD-10-CM | POA: Diagnosis not present

## 2022-03-01 DIAGNOSIS — A419 Sepsis, unspecified organism: Secondary | ICD-10-CM | POA: Diagnosis not present

## 2022-03-01 DIAGNOSIS — N281 Cyst of kidney, acquired: Secondary | ICD-10-CM | POA: Diagnosis not present

## 2022-03-01 DIAGNOSIS — N3289 Other specified disorders of bladder: Secondary | ICD-10-CM | POA: Diagnosis not present

## 2022-03-01 DIAGNOSIS — Z452 Encounter for adjustment and management of vascular access device: Secondary | ICD-10-CM | POA: Diagnosis not present

## 2022-03-01 DIAGNOSIS — Z4682 Encounter for fitting and adjustment of non-vascular catheter: Secondary | ICD-10-CM | POA: Diagnosis not present

## 2022-03-01 DIAGNOSIS — R599 Enlarged lymph nodes, unspecified: Secondary | ICD-10-CM | POA: Diagnosis not present

## 2022-03-01 DIAGNOSIS — E860 Dehydration: Secondary | ICD-10-CM | POA: Diagnosis not present

## 2022-03-01 DIAGNOSIS — N179 Acute kidney failure, unspecified: Secondary | ICD-10-CM | POA: Diagnosis not present

## 2022-03-01 DIAGNOSIS — K7689 Other specified diseases of liver: Secondary | ICD-10-CM | POA: Diagnosis not present

## 2022-03-01 DIAGNOSIS — R160 Hepatomegaly, not elsewhere classified: Secondary | ICD-10-CM | POA: Diagnosis not present

## 2022-03-01 DIAGNOSIS — K56609 Unspecified intestinal obstruction, unspecified as to partial versus complete obstruction: Secondary | ICD-10-CM | POA: Diagnosis not present

## 2022-03-01 DIAGNOSIS — K565 Intestinal adhesions [bands], unspecified as to partial versus complete obstruction: Secondary | ICD-10-CM | POA: Diagnosis not present

## 2022-03-01 DIAGNOSIS — N1831 Chronic kidney disease, stage 3a: Secondary | ICD-10-CM | POA: Diagnosis not present

## 2022-03-01 DIAGNOSIS — N170 Acute kidney failure with tubular necrosis: Secondary | ICD-10-CM | POA: Diagnosis not present

## 2022-03-01 DIAGNOSIS — R410 Disorientation, unspecified: Secondary | ICD-10-CM | POA: Diagnosis not present

## 2022-03-01 DIAGNOSIS — I129 Hypertensive chronic kidney disease with stage 1 through stage 4 chronic kidney disease, or unspecified chronic kidney disease: Secondary | ICD-10-CM | POA: Diagnosis not present

## 2022-03-01 DIAGNOSIS — R188 Other ascites: Secondary | ICD-10-CM | POA: Diagnosis not present

## 2022-03-01 DIAGNOSIS — K650 Generalized (acute) peritonitis: Secondary | ICD-10-CM | POA: Diagnosis not present

## 2022-03-01 DIAGNOSIS — M1990 Unspecified osteoarthritis, unspecified site: Secondary | ICD-10-CM | POA: Diagnosis not present

## 2022-03-01 DIAGNOSIS — N400 Enlarged prostate without lower urinary tract symptoms: Secondary | ICD-10-CM | POA: Diagnosis not present

## 2022-03-01 DIAGNOSIS — R443 Hallucinations, unspecified: Secondary | ICD-10-CM | POA: Diagnosis not present

## 2022-03-01 DIAGNOSIS — K56600 Partial intestinal obstruction, unspecified as to cause: Secondary | ICD-10-CM | POA: Diagnosis not present

## 2022-03-01 DIAGNOSIS — D849 Immunodeficiency, unspecified: Secondary | ICD-10-CM | POA: Diagnosis not present

## 2022-03-01 DIAGNOSIS — R918 Other nonspecific abnormal finding of lung field: Secondary | ICD-10-CM | POA: Diagnosis not present

## 2022-03-01 DIAGNOSIS — K8020 Calculus of gallbladder without cholecystitis without obstruction: Secondary | ICD-10-CM | POA: Diagnosis not present

## 2022-03-01 DIAGNOSIS — R6521 Severe sepsis with septic shock: Secondary | ICD-10-CM | POA: Diagnosis not present

## 2022-03-01 DIAGNOSIS — E278 Other specified disorders of adrenal gland: Secondary | ICD-10-CM | POA: Diagnosis not present

## 2022-03-01 DIAGNOSIS — K631 Perforation of intestine (nontraumatic): Secondary | ICD-10-CM | POA: Diagnosis not present

## 2022-03-12 ENCOUNTER — Telehealth: Payer: Self-pay | Admitting: *Deleted

## 2022-03-12 ENCOUNTER — Encounter: Payer: Self-pay | Admitting: *Deleted

## 2022-03-12 NOTE — Patient Outreach (Signed)
  Care Coordination TOC Note Transition Care Management Follow-up Telephone Call Date of discharge and from where: [facility] on 03/10/22 How have you been since you were released from the hospital? "I'm doing good" Any questions or concerns? Yes. Switching PCPs. Unhappy with [facility] due to "no show" policy. Scheduling an appt with [facility] in [facility].  Items Reviewed: Did the pt receive and understand the discharge instructions provided? Yes  Medications obtained and verified? Yes . Taking Flomax since discharge Other? Yes . Reviewed upcoming urology appt and plan to remove catheter and test bladder function Any new allergies since your discharge? No  Dietary orders reviewed? Yes Do you have support at home? Yes   Home Care and Equipment/Supplies: Were home health services ordered? no If so, what is the name of the agency?   Has the agency set up a time to come to the patient's home? not applicable Were any new equipment or medical supplies ordered?  Yes: Rolling Walker and bedside commode What is the name of the medical supply agency? Patient unsure and documentation unavailable in [facility] through Care Everywhere Were you able to get the supplies/equipment? yes Do you have any questions related to the use of the equipment or supplies? No  Functional Questionnaire: (I = Independent and D = Dependent) ADLs: I  Bathing/Dressing- I  Meal Prep- I  Eating- I  Maintaining continence- I  Transferring/Ambulation- I  Managing Meds- I  Follow up appointments reviewed:  PCP Hospital f/u appt confirmed?  Not Indicated   Specialist Hospital f/u appt confirmed? Yes  Scheduled to see Lady Gary, PA-C on 03/14/22 at 9:15 Are transportation arrangements needed? No  If their condition worsens, is the pt aware to call PCP or go to the Emergency Dept.? Yes Was the patient provided with contact information for the PCP's office or ED? Yes Was to pt encouraged to call  back with questions or concerns? Yes  SDOH assessments and interventions completed:   Yes SDOH Interventions Today    Flowsheet Row Most Recent Value  SDOH Interventions   Housing Interventions Intervention Not Indicated  Transportation Interventions Intervention Not Indicated       Care Coordination Interventions:  No Care Coordination interventions needed at this time.   Encounter Outcome:  Pt. Visit Completed    Chong Sicilian, BSN, RN-BC RN Care Coordinator [facility] Direct Dial: [phone] Main #: [phone]

## 2022-03-16 DIAGNOSIS — R339 Retention of urine, unspecified: Secondary | ICD-10-CM | POA: Diagnosis not present

## 2022-03-16 DIAGNOSIS — N186 End stage renal disease: Secondary | ICD-10-CM | POA: Diagnosis not present

## 2022-03-16 DIAGNOSIS — N179 Acute kidney failure, unspecified: Secondary | ICD-10-CM | POA: Diagnosis not present

## 2022-03-16 DIAGNOSIS — T8619 Other complication of kidney transplant: Secondary | ICD-10-CM | POA: Diagnosis not present

## 2022-03-23 DIAGNOSIS — Z940 Kidney transplant status: Secondary | ICD-10-CM | POA: Diagnosis not present

## 2022-03-23 DIAGNOSIS — Q613 Polycystic kidney, unspecified: Secondary | ICD-10-CM | POA: Diagnosis not present

## 2022-03-23 DIAGNOSIS — I10 Essential (primary) hypertension: Secondary | ICD-10-CM | POA: Diagnosis not present

## 2022-03-23 DIAGNOSIS — J449 Chronic obstructive pulmonary disease, unspecified: Secondary | ICD-10-CM | POA: Diagnosis not present

## 2022-03-28 DIAGNOSIS — R972 Elevated prostate specific antigen [PSA]: Secondary | ICD-10-CM | POA: Diagnosis not present

## 2022-04-09 DIAGNOSIS — T8619 Other complication of kidney transplant: Secondary | ICD-10-CM | POA: Diagnosis not present

## 2022-04-09 DIAGNOSIS — R972 Elevated prostate specific antigen [PSA]: Secondary | ICD-10-CM | POA: Diagnosis not present

## 2022-04-09 DIAGNOSIS — N186 End stage renal disease: Secondary | ICD-10-CM | POA: Diagnosis not present

## 2022-04-09 DIAGNOSIS — C61 Malignant neoplasm of prostate: Secondary | ICD-10-CM | POA: Diagnosis not present

## 2022-04-12 DIAGNOSIS — Z5111 Encounter for antineoplastic chemotherapy: Secondary | ICD-10-CM | POA: Diagnosis not present

## 2022-04-12 DIAGNOSIS — C61 Malignant neoplasm of prostate: Secondary | ICD-10-CM | POA: Diagnosis not present

## 2022-04-25 DIAGNOSIS — C7951 Secondary malignant neoplasm of bone: Secondary | ICD-10-CM | POA: Diagnosis not present

## 2022-04-25 DIAGNOSIS — C61 Malignant neoplasm of prostate: Secondary | ICD-10-CM | POA: Diagnosis not present

## 2022-05-15 DIAGNOSIS — N186 End stage renal disease: Secondary | ICD-10-CM | POA: Diagnosis not present

## 2022-05-15 DIAGNOSIS — Z5181 Encounter for therapeutic drug level monitoring: Secondary | ICD-10-CM | POA: Diagnosis not present

## 2022-05-15 DIAGNOSIS — G629 Polyneuropathy, unspecified: Secondary | ICD-10-CM | POA: Diagnosis not present

## 2022-05-15 DIAGNOSIS — I120 Hypertensive chronic kidney disease with stage 5 chronic kidney disease or end stage renal disease: Secondary | ICD-10-CM | POA: Diagnosis not present

## 2022-05-15 DIAGNOSIS — C61 Malignant neoplasm of prostate: Secondary | ICD-10-CM | POA: Diagnosis not present

## 2022-05-15 DIAGNOSIS — Z79818 Long term (current) use of other agents affecting estrogen receptors and estrogen levels: Secondary | ICD-10-CM | POA: Diagnosis not present

## 2022-05-15 DIAGNOSIS — Z940 Kidney transplant status: Secondary | ICD-10-CM | POA: Diagnosis not present

## 2022-05-15 DIAGNOSIS — Q613 Polycystic kidney, unspecified: Secondary | ICD-10-CM | POA: Diagnosis not present

## 2022-05-15 DIAGNOSIS — Z5111 Encounter for antineoplastic chemotherapy: Secondary | ICD-10-CM | POA: Diagnosis not present

## 2022-05-17 DIAGNOSIS — N1831 Chronic kidney disease, stage 3a: Secondary | ICD-10-CM | POA: Diagnosis not present

## 2022-05-17 DIAGNOSIS — D849 Immunodeficiency, unspecified: Secondary | ICD-10-CM | POA: Diagnosis not present

## 2022-05-17 DIAGNOSIS — Z79899 Other long term (current) drug therapy: Secondary | ICD-10-CM | POA: Diagnosis not present

## 2022-05-17 DIAGNOSIS — Z940 Kidney transplant status: Secondary | ICD-10-CM | POA: Diagnosis not present

## 2022-05-17 DIAGNOSIS — I10 Essential (primary) hypertension: Secondary | ICD-10-CM | POA: Diagnosis not present

## 2022-05-17 DIAGNOSIS — T8619 Other complication of kidney transplant: Secondary | ICD-10-CM | POA: Diagnosis not present

## 2022-05-17 DIAGNOSIS — N186 End stage renal disease: Secondary | ICD-10-CM | POA: Diagnosis not present
